# Patient Record
Sex: Male | Born: 1950 | Race: White | Hispanic: No | Marital: Married | Smoking: Never smoker
Health system: Southern US, Community
[De-identification: ages and names within clinical notes are randomized; demographics above are authoritative.]

## PROBLEM LIST (undated history)

## (undated) DIAGNOSIS — Z8739 Personal history of other diseases of the musculoskeletal system and connective tissue: Secondary | ICD-10-CM

## (undated) DIAGNOSIS — G4730 Sleep apnea, unspecified: Secondary | ICD-10-CM

## (undated) DIAGNOSIS — M1990 Unspecified osteoarthritis, unspecified site: Secondary | ICD-10-CM

## (undated) DIAGNOSIS — M2550 Pain in unspecified joint: Secondary | ICD-10-CM

## (undated) DIAGNOSIS — M5430 Sciatica, unspecified side: Secondary | ICD-10-CM

## (undated) DIAGNOSIS — Z9889 Other specified postprocedural states: Secondary | ICD-10-CM

## (undated) DIAGNOSIS — I10 Essential (primary) hypertension: Secondary | ICD-10-CM

## (undated) DIAGNOSIS — M1712 Unilateral primary osteoarthritis, left knee: Secondary | ICD-10-CM

## (undated) DIAGNOSIS — M2540 Effusion, unspecified joint: Secondary | ICD-10-CM

## (undated) DIAGNOSIS — R112 Nausea with vomiting, unspecified: Secondary | ICD-10-CM

## (undated) DIAGNOSIS — J302 Other seasonal allergic rhinitis: Secondary | ICD-10-CM

## (undated) HISTORY — PX: TONSILLECTOMY: SUR1361

## (undated) HISTORY — PX: KNEE ARTHROSCOPY: SUR90

## (undated) HISTORY — PX: OTHER SURGICAL HISTORY: SHX169

## (undated) HISTORY — PX: BACK SURGERY: SHX140

---

## 2013-09-03 HISTORY — PX: JOINT REPLACEMENT: SHX530

## 2013-10-19 ENCOUNTER — Other Ambulatory Visit: Payer: Self-pay | Admitting: Orthopedic Surgery

## 2013-10-21 ENCOUNTER — Other Ambulatory Visit: Payer: Self-pay | Admitting: Orthopedic Surgery

## 2013-10-29 ENCOUNTER — Encounter (HOSPITAL_COMMUNITY): Payer: Self-pay | Admitting: Pharmacy Technician

## 2013-10-30 NOTE — Pre-Procedure Instructions (Signed)
Grier RocherJerry W Pinckney  10/30/2013   Your procedure is scheduled on:  Mon, Mar 9 @ 7:30 AM  [facility]  at 5:30 AM.  Call this number if you have problems the morning of surgery: [phone]   Remember:   Do not eat food or drink liquids after midnight.   Take these medicines the morning of surgery with A SIP OF WATER: Zyrtec(Cetirizine),Afrin(if needed),and Pain Pill(if needed)               Stop taking your Aspirin,Aleve,and Ibuprofen. No Goody's,BC's,Fish Oil,or any Herbal Medications   Do not wear jewelry  Do not wear lotions, powders, or colognes. You may wear deodorant.  Men may shave face and neck.  Do not bring valuables to the hospital.  [facility] is not responsible                  for any belongings or valuables.               Contacts, dentures or bridgework may not be worn into surgery.  Leave suitcase in the car. After surgery it may be brought to your room.  For patients admitted to the hospital, discharge time is determined by your                treatment team.                Special Instructions:  [place] - Preparing for Surgery  Before surgery, you can play an important role.  Because skin is not sterile, your skin needs to be as free of germs as possible.  You can reduce the number of germs on you skin by washing with CHG (chlorahexidine gluconate) soap before surgery.  CHG is an antiseptic cleaner which kills germs and bonds with the skin to continue killing germs even after washing.  Please DO NOT use if you have an allergy to CHG or antibacterial soaps.  If your skin becomes reddened/irritated stop using the CHG and inform your nurse when you arrive at Short Stay.  Do not shave (including legs and underarms) for at least 48 hours prior to the first CHG shower.  You may shave your face.  Please follow these instructions carefully:   1.  Shower with CHG Soap the night before surgery and the                                morning of  Surgery.  2.  If you choose to wash your hair, wash your hair first as usual with your       normal shampoo.  3.  After you shampoo, rinse your hair and body thoroughly to remove the                      Shampoo.  4.  Use CHG as you would any other liquid soap.  You can apply chg directly       to the skin and wash gently with scrungie or a clean washcloth.  5.  Apply the CHG Soap to your body ONLY FROM THE NECK DOWN.        Do not use on open wounds or open sores.  Avoid contact with your eyes,       ears, mouth and genitals (private parts).  Wash genitals (private parts)       with your  normal soap.  6.  Wash thoroughly, paying special attention to the area where your surgery        will be performed.  7.  Thoroughly rinse your body with warm water from the neck down.  8.  DO NOT shower/wash with your normal soap after using and rinsing off       the CHG Soap.  9.  Pat yourself dry with a clean towel.            10.  Wear clean pajamas.            11.  Place clean sheets on your bed the night of your first shower and do not        sleep with pets.  Day of Surgery  Do not apply any lotions/deoderants the morning of surgery.  Please wear clean clothes to the hospital/surgery center.     Please read over the following fact sheets that you were given: Pain Booklet, Coughing and Deep Breathing, Blood Transfusion Information, MRSA Information and Surgical Site Infection Prevention

## 2013-11-02 ENCOUNTER — Ambulatory Visit (HOSPITAL_COMMUNITY)
Admission: RE | Admit: 2013-11-02 | Discharge: 2013-11-02 | Disposition: A | Discharge status: Home / Self Care | Payer: 59 | Source: Ambulatory Visit | Attending: Orthopedic Surgery | Admitting: Orthopedic Surgery

## 2013-11-02 ENCOUNTER — Encounter (HOSPITAL_COMMUNITY)
Admission: RE | Admit: 2013-11-02 | Discharge: 2013-11-02 | Disposition: A | Discharge status: Home / Self Care | Payer: 59 | Source: Ambulatory Visit | Attending: Orthopedic Surgery | Admitting: Orthopedic Surgery

## 2013-11-02 ENCOUNTER — Encounter (HOSPITAL_COMMUNITY): Payer: Self-pay

## 2013-11-02 DIAGNOSIS — Z01812 Encounter for preprocedural laboratory examination: Secondary | ICD-10-CM | POA: Insufficient documentation

## 2013-11-02 HISTORY — DX: Effusion, unspecified joint: M25.40

## 2013-11-02 HISTORY — DX: Essential (primary) hypertension: I10

## 2013-11-02 HISTORY — DX: Unspecified osteoarthritis, unspecified site: M19.90

## 2013-11-02 HISTORY — DX: Pain in unspecified joint: M25.50

## 2013-11-02 HISTORY — DX: Other specified postprocedural states: Z98.890

## 2013-11-02 HISTORY — DX: Sciatica, unspecified side: M54.30

## 2013-11-02 HISTORY — DX: Personal history of other diseases of the musculoskeletal system and connective tissue: Z87.39

## 2013-11-02 HISTORY — DX: Nausea with vomiting, unspecified: R11.2

## 2013-11-02 HISTORY — DX: Other seasonal allergic rhinitis: J30.2

## 2013-11-02 LAB — COMPREHENSIVE METABOLIC PANEL
ALT: 43 U/L (ref 0–53)
AST: 42 U/L — ABNORMAL HIGH (ref 0–37)
Albumin: 3.9 g/dL (ref 3.5–5.2)
Alkaline Phosphatase: 66 U/L (ref 39–117)
BUN: 17 mg/dL (ref 6–23)
CALCIUM: 9.4 mg/dL (ref 8.4–10.5)
CO2: 29 mEq/L (ref 19–32)
CREATININE: 1.01 mg/dL (ref 0.50–1.35)
Chloride: 98 mEq/L (ref 96–112)
GFR, EST NON AFRICAN AMERICAN: 78 mL/min — AB (ref >90)
GLUCOSE: 107 mg/dL — AB (ref 70–99)
Potassium: 3.9 mEq/L (ref 3.7–5.3)
SODIUM: 138 meq/L (ref 137–147)
TOTAL PROTEIN: 7.7 g/dL (ref 6.0–8.3)
Total Bilirubin: 1.5 mg/dL — ABNORMAL HIGH (ref 0.3–1.2)

## 2013-11-02 LAB — APTT: aPTT: 25 seconds (ref 24–37)

## 2013-11-02 LAB — CBC WITH DIFFERENTIAL/PLATELET
Basophils Absolute: 0 10*3/uL (ref 0.0–0.1)
Basophils Relative: 0 % (ref 0–1)
EOS ABS: 0.1 10*3/uL (ref 0.0–0.7)
EOS PCT: 3 % (ref 0–5)
HEMATOCRIT: 45.7 % (ref 39.0–52.0)
Hemoglobin: 15.8 g/dL (ref 13.0–17.0)
LYMPHS ABS: 1.3 10*3/uL (ref 0.7–4.0)
Lymphocytes Relative: 24 % (ref 12–46)
MCH: 31.8 pg (ref 26.0–34.0)
MCHC: 34.6 g/dL (ref 30.0–36.0)
MCV: 92 fL (ref 78.0–100.0)
MONO ABS: 0.5 10*3/uL (ref 0.1–1.0)
Monocytes Relative: 9 % (ref 3–12)
Neutro Abs: 3.4 10*3/uL (ref 1.7–7.7)
Neutrophils Relative %: 64 % (ref 43–77)
PLATELETS: 211 10*3/uL (ref 150–400)
RBC: 4.97 MIL/uL (ref 4.22–5.81)
RDW: 12.9 % (ref 11.5–15.5)
WBC: 5.3 10*3/uL (ref 4.0–10.5)

## 2013-11-02 LAB — SURGICAL PCR SCREEN
MRSA, PCR: NEGATIVE
Staphylococcus aureus: NEGATIVE

## 2013-11-02 LAB — PROTIME-INR
INR: 1.05 (ref 0.00–1.49)
PROTHROMBIN TIME: 13.5 s (ref 11.6–15.2)

## 2013-11-02 LAB — TYPE AND SCREEN
ABO/RH(D): O NEG
Antibody Screen: NEGATIVE

## 2013-11-02 LAB — ABO/RH: ABO/RH(D): O NEG

## 2013-11-02 MED ORDER — CHLORHEXIDINE GLUCONATE 4 % EX LIQD: 60.0000 mL | Freq: Once | CUTANEOUS | Status: DC

## 2013-11-02 NOTE — Progress Notes (Signed)
Measured pt for Ted hose and gave daughter his measurement;she owns a company that sells Teds.He will bring them DOS

## 2013-11-02 NOTE — Progress Notes (Signed)
11/02/13 0911  OBSTRUCTIVE SLEEP APNEA  Have you ever been diagnosed with sleep apnea through a sleep study? No  Do you snore loudly (loud enough to be heard through closed doors)?  1  Do you often feel tired, fatigued, or sleepy during the daytime? 0  Has anyone observed you stop breathing during your sleep? 1  Do you have, or are you being treated for high blood pressure? 1  BMI more than 35 kg/m2? 0  Age over 63 years old? 1  Neck circumference greater than 40 cm/18 inches? 0 (17 1/2)  Gender: 1  Obstructive Sleep Apnea Score 5  Score 4 or greater  Results sent to PCP

## 2013-11-02 NOTE — Progress Notes (Signed)
Called main lab to see why UA and Culture was cancelled;urine cup was either broken in transit and urine leaked out;not enough to obtain sample

## 2013-11-02 NOTE — Progress Notes (Addendum)
Pt doesn't have a cardiologist  [facility] done at [facility] [place]  [place]cho done at [facility] [place]   Stress test done 20+yrs ago  Pt denies ever having a heart cath  Denies CXR in past yr    Medical Md is Dr.Byron Mikey BussingHoffman sees NP Jacqlyn KraussMargaret Brewer

## 2013-11-08 MED ORDER — DEXTROSE 5 % IV SOLN
3.0000 g | INTRAVENOUS | Status: AC
  Administered 2013-11-09: 3 g via INTRAVENOUS
  Filled 2013-11-08 (×2): qty 3000

## 2013-11-08 MED ORDER — TRANEXAMIC ACID 100 MG/ML IV SOLN
1000.0000 mg | INTRAVENOUS | Status: AC
  Administered 2013-11-09: 1000 mg via INTRAVENOUS
  Filled 2013-11-08: qty 10

## 2013-11-09 ENCOUNTER — Inpatient Hospital Stay (HOSPITAL_COMMUNITY): Payer: 59 | Admitting: Anesthesiology

## 2013-11-09 ENCOUNTER — Encounter (HOSPITAL_COMMUNITY): Payer: 59 | Admitting: Anesthesiology

## 2013-11-09 ENCOUNTER — Encounter (HOSPITAL_COMMUNITY): Payer: Self-pay | Admitting: Anesthesiology

## 2013-11-09 ENCOUNTER — Encounter (HOSPITAL_COMMUNITY)
Admission: RE | Disposition: A | Discharge status: Home / Self Care | Payer: Self-pay | Source: Ambulatory Visit | Attending: Orthopedic Surgery

## 2013-11-09 ENCOUNTER — Inpatient Hospital Stay (HOSPITAL_COMMUNITY)
Admission: RE | Admit: 2013-11-09 | Discharge: 2013-11-10 | DRG: 470 | Disposition: A | Discharge status: Home / Self Care | Payer: 59 | Source: Ambulatory Visit | Attending: Orthopedic Surgery | Admitting: Orthopedic Surgery

## 2013-11-09 DIAGNOSIS — Z79899 Other long term (current) drug therapy: Secondary | ICD-10-CM

## 2013-11-09 DIAGNOSIS — I10 Essential (primary) hypertension: Secondary | ICD-10-CM | POA: Diagnosis present

## 2013-11-09 DIAGNOSIS — D62 Acute posthemorrhagic anemia: Secondary | ICD-10-CM | POA: Diagnosis not present

## 2013-11-09 DIAGNOSIS — M1710 Unilateral primary osteoarthritis, unspecified knee: Principal | ICD-10-CM | POA: Diagnosis present

## 2013-11-09 DIAGNOSIS — Z96659 Presence of unspecified artificial knee joint: Secondary | ICD-10-CM

## 2013-11-09 DIAGNOSIS — Z7982 Long term (current) use of aspirin: Secondary | ICD-10-CM

## 2013-11-09 HISTORY — PX: TOTAL KNEE ARTHROPLASTY: SHX125

## 2013-11-09 LAB — CBC
HEMATOCRIT: 42.5 % (ref 39.0–52.0)
HEMOGLOBIN: 15.2 g/dL (ref 13.0–17.0)
MCH: 32.5 pg (ref 26.0–34.0)
MCHC: 35.8 g/dL (ref 30.0–36.0)
MCV: 91 fL (ref 78.0–100.0)
Platelets: 187 10*3/uL (ref 150–400)
RBC: 4.67 MIL/uL (ref 4.22–5.81)
RDW: 12.8 % (ref 11.5–15.5)
WBC: 9 10*3/uL (ref 4.0–10.5)

## 2013-11-09 LAB — CREATININE, SERUM
CREATININE: 0.94 mg/dL (ref 0.50–1.35)
GFR calc Af Amer: >90 mL/min (ref >90)
GFR, EST NON AFRICAN AMERICAN: 88 mL/min — AB (ref >90)

## 2013-11-09 LAB — URINALYSIS, ROUTINE W REFLEX MICROSCOPIC
Glucose, UA: NEGATIVE mg/dL
Hgb urine dipstick: NEGATIVE
Ketones, ur: NEGATIVE mg/dL
LEUKOCYTES UA: NEGATIVE
NITRITE: NEGATIVE
PROTEIN: NEGATIVE mg/dL
Specific Gravity, Urine: 1.019 (ref 1.005–1.030)
Urobilinogen, UA: 1 mg/dL (ref 0.0–1.0)
pH: 6 (ref 5.0–8.0)

## 2013-11-09 SURGERY — ARTHROPLASTY, KNEE, TOTAL
Anesthesia: Regional | Site: Knee | Laterality: Right

## 2013-11-09 MED ORDER — SODIUM CHLORIDE 0.9 % IR SOLN
Status: DC | PRN
  Administered 2013-11-09: 1000 mL

## 2013-11-09 MED ORDER — MENTHOL 3 MG MT LOZG: 1.0000 | LOZENGE | OROMUCOSAL | Status: DC | PRN

## 2013-11-09 MED ORDER — ENOXAPARIN SODIUM 30 MG/0.3ML ~~LOC~~ SOLN
30.0000 mg | Freq: Two times a day (BID) | SUBCUTANEOUS | Status: DC
  Administered 2013-11-10: 30 mg via SUBCUTANEOUS
  Filled 2013-11-09 (×3): qty 0.3

## 2013-11-09 MED ORDER — ONDANSETRON HCL 4 MG/2ML IJ SOLN
4.0000 mg | Freq: Four times a day (QID) | INTRAMUSCULAR | Status: DC | PRN
  Administered 2013-11-09: 4 mg via INTRAVENOUS
  Filled 2013-11-09: qty 2

## 2013-11-09 MED ORDER — ACETAMINOPHEN 650 MG RE SUPP: 650.0000 mg | Freq: Four times a day (QID) | RECTAL | Status: DC | PRN

## 2013-11-09 MED ORDER — OXYCODONE HCL 5 MG PO TABS
5.0000 mg | ORAL_TABLET | ORAL | Status: DC | PRN
  Administered 2013-11-09 – 2013-11-10 (×7): 10 mg via ORAL
  Filled 2013-11-09 (×8): qty 2

## 2013-11-09 MED ORDER — SODIUM CHLORIDE 0.9 % IV SOLN
INTRAVENOUS | Status: DC
Start: 2013-11-09 — End: 2013-11-09

## 2013-11-09 MED ORDER — HYDROCHLOROTHIAZIDE 25 MG PO TABS
25.0000 mg | ORAL_TABLET | Freq: Every day | ORAL | Status: DC
  Administered 2013-11-10: 25 mg via ORAL
  Filled 2013-11-09 (×2): qty 1

## 2013-11-09 MED ORDER — FENTANYL CITRATE 0.05 MG/ML IJ SOLN
INTRAMUSCULAR | Status: DC | PRN
  Administered 2013-11-09: 50 ug via INTRAVENOUS
  Administered 2013-11-09: 100 ug via INTRAVENOUS
  Administered 2013-11-09 (×2): 50 ug via INTRAVENOUS

## 2013-11-09 MED ORDER — LORATADINE 10 MG PO TABS
10.0000 mg | ORAL_TABLET | Freq: Every day | ORAL | Status: DC
  Administered 2013-11-10: 10 mg via ORAL
  Filled 2013-11-09 (×2): qty 1

## 2013-11-09 MED ORDER — METHOCARBAMOL 100 MG/ML IJ SOLN
500.0000 mg | Freq: Four times a day (QID) | INTRAVENOUS | Status: DC | PRN
  Administered 2013-11-09: 500 mg via INTRAVENOUS
  Filled 2013-11-09: qty 5

## 2013-11-09 MED ORDER — OXYCODONE HCL ER 10 MG PO T12A
10.0000 mg | EXTENDED_RELEASE_TABLET | Freq: Two times a day (BID) | ORAL | Status: DC
  Administered 2013-11-09 – 2013-11-10 (×3): 10 mg via ORAL
  Filled 2013-11-09 (×3): qty 1

## 2013-11-09 MED ORDER — METOCLOPRAMIDE HCL 5 MG/ML IJ SOLN
5.0000 mg | Freq: Three times a day (TID) | INTRAMUSCULAR | Status: DC | PRN
Start: 2013-11-09 — End: 2013-11-10

## 2013-11-09 MED ORDER — OXYMETAZOLINE HCL 0.05 % NA SOLN
1.0000 | Freq: Two times a day (BID) | NASAL | Status: DC | PRN
  Filled 2013-11-09: qty 15

## 2013-11-09 MED ORDER — LIDOCAINE HCL (CARDIAC) 20 MG/ML IV SOLN
INTRAVENOUS | Status: DC | PRN
  Administered 2013-11-09: 100 mg via INTRAVENOUS

## 2013-11-09 MED ORDER — DOCUSATE SODIUM 100 MG PO CAPS
100.0000 mg | ORAL_CAPSULE | Freq: Two times a day (BID) | ORAL | Status: DC
  Administered 2013-11-09 – 2013-11-10 (×2): 100 mg via ORAL
  Filled 2013-11-09 (×3): qty 1

## 2013-11-09 MED ORDER — DIPHENHYDRAMINE HCL 12.5 MG/5ML PO ELIX
12.5000 mg | ORAL_SOLUTION | ORAL | Status: DC | PRN
  Administered 2013-11-09 (×2): 25 mg via ORAL
  Filled 2013-11-09 (×3): qty 10

## 2013-11-09 MED ORDER — BUPIVACAINE-EPINEPHRINE 0.5% -1:200000 IJ SOLN
INTRAMUSCULAR | Status: DC | PRN
  Administered 2013-11-09: 30 mL

## 2013-11-09 MED ORDER — PROPOFOL 10 MG/ML IV BOLUS
INTRAVENOUS | Status: AC
  Filled 2013-11-09: qty 20

## 2013-11-09 MED ORDER — HYDROMORPHONE HCL PF 1 MG/ML IJ SOLN
0.2500 mg | INTRAMUSCULAR | Status: DC | PRN
  Administered 2013-11-09 (×4): 0.5 mg via INTRAVENOUS

## 2013-11-09 MED ORDER — BUPIVACAINE LIPOSOME 1.3 % IJ SUSP
20.0000 mL | Freq: Once | INTRAMUSCULAR | Status: DC
  Filled 2013-11-09: qty 20

## 2013-11-09 MED ORDER — MIDAZOLAM HCL 2 MG/2ML IJ SOLN
INTRAMUSCULAR | Status: AC
  Filled 2013-11-09: qty 2

## 2013-11-09 MED ORDER — ONDANSETRON HCL 4 MG/2ML IJ SOLN
INTRAMUSCULAR | Status: AC
  Filled 2013-11-09: qty 2

## 2013-11-09 MED ORDER — HYDROMORPHONE HCL PF 1 MG/ML IJ SOLN
INTRAMUSCULAR | Status: AC
  Filled 2013-11-09: qty 1

## 2013-11-09 MED ORDER — ALUM & MAG HYDROXIDE-SIMETH 200-200-20 MG/5ML PO SUSP: 30.0000 mL | ORAL | Status: DC | PRN

## 2013-11-09 MED ORDER — DEXAMETHASONE SODIUM PHOSPHATE 4 MG/ML IJ SOLN
INTRAMUSCULAR | Status: DC | PRN
Start: 2013-11-09 — End: 2013-11-09
  Administered 2013-11-09: 4 mg via INTRAVENOUS

## 2013-11-09 MED ORDER — ACETAMINOPHEN 325 MG PO TABS: 650.0000 mg | ORAL_TABLET | Freq: Four times a day (QID) | ORAL | Status: DC | PRN

## 2013-11-09 MED ORDER — PROPOFOL 10 MG/ML IV BOLUS
INTRAVENOUS | Status: DC | PRN
  Administered 2013-11-09: 200 mg via INTRAVENOUS

## 2013-11-09 MED ORDER — CEFAZOLIN SODIUM-DEXTROSE 2-3 GM-% IV SOLR
2.0000 g | Freq: Four times a day (QID) | INTRAVENOUS | Status: AC
  Administered 2013-11-09: 2 g via INTRAVENOUS
  Filled 2013-11-09 (×2): qty 50

## 2013-11-09 MED ORDER — BUPIVACAINE-EPINEPHRINE PF 0.5-1:200000 % IJ SOLN
INTRAMUSCULAR | Status: DC | PRN
  Administered 2013-11-09: 20 mL via PERINEURAL

## 2013-11-09 MED ORDER — ONDANSETRON HCL 4 MG PO TABS: 4.0000 mg | ORAL_TABLET | Freq: Four times a day (QID) | ORAL | Status: DC | PRN

## 2013-11-09 MED ORDER — LACTATED RINGERS IV SOLN
INTRAVENOUS | Status: DC | PRN
  Administered 2013-11-09 (×2): via INTRAVENOUS

## 2013-11-09 MED ORDER — ARTIFICIAL TEARS OP OINT
TOPICAL_OINTMENT | OPHTHALMIC | Status: AC
  Filled 2013-11-09: qty 3.5

## 2013-11-09 MED ORDER — GLYCOPYRROLATE 0.2 MG/ML IJ SOLN
INTRAMUSCULAR | Status: DC | PRN
  Administered 2013-11-09 (×2): 0.2 mg via INTRAVENOUS

## 2013-11-09 MED ORDER — ONDANSETRON HCL 4 MG/2ML IJ SOLN: 4.0000 mg | Freq: Once | INTRAMUSCULAR | Status: DC | PRN

## 2013-11-09 MED ORDER — LISINOPRIL 40 MG PO TABS
40.0000 mg | ORAL_TABLET | Freq: Every day | ORAL | Status: DC
  Administered 2013-11-10: 40 mg via ORAL
  Filled 2013-11-09 (×2): qty 1

## 2013-11-09 MED ORDER — ONDANSETRON HCL 4 MG/2ML IJ SOLN
INTRAMUSCULAR | Status: DC | PRN
  Administered 2013-11-09: 4 mg via INTRAVENOUS

## 2013-11-09 MED ORDER — METOCLOPRAMIDE HCL 10 MG PO TABS: 5.0000 mg | ORAL_TABLET | Freq: Three times a day (TID) | ORAL | Status: DC | PRN

## 2013-11-09 MED ORDER — PHENOL 1.4 % MT LIQD: 1.0000 | OROMUCOSAL | Status: DC | PRN

## 2013-11-09 MED ORDER — ROCURONIUM BROMIDE 50 MG/5ML IV SOLN
INTRAVENOUS | Status: AC
  Filled 2013-11-09: qty 1

## 2013-11-09 MED ORDER — BUPIVACAINE-EPINEPHRINE (PF) 0.5% -1:200000 IJ SOLN
INTRAMUSCULAR | Status: AC
  Filled 2013-11-09: qty 10

## 2013-11-09 MED ORDER — CELECOXIB 200 MG PO CAPS
200.0000 mg | ORAL_CAPSULE | Freq: Two times a day (BID) | ORAL | Status: DC
  Administered 2013-11-09 – 2013-11-10 (×2): 200 mg via ORAL
  Filled 2013-11-09 (×4): qty 1

## 2013-11-09 MED ORDER — METHOCARBAMOL 500 MG PO TABS
500.0000 mg | ORAL_TABLET | Freq: Four times a day (QID) | ORAL | Status: DC | PRN
  Administered 2013-11-09 – 2013-11-10 (×2): 500 mg via ORAL
  Filled 2013-11-09 (×4): qty 1

## 2013-11-09 MED ORDER — ARTIFICIAL TEARS OP OINT
TOPICAL_OINTMENT | OPHTHALMIC | Status: DC | PRN
  Administered 2013-11-09: 1 via OPHTHALMIC

## 2013-11-09 MED ORDER — FENTANYL CITRATE 0.05 MG/ML IJ SOLN
INTRAMUSCULAR | Status: AC
  Filled 2013-11-09: qty 5

## 2013-11-09 MED ORDER — SENNOSIDES-DOCUSATE SODIUM 8.6-50 MG PO TABS: 1.0000 | ORAL_TABLET | Freq: Every evening | ORAL | Status: DC | PRN

## 2013-11-09 MED ORDER — BISACODYL 5 MG PO TBEC: 5.0000 mg | DELAYED_RELEASE_TABLET | Freq: Every day | ORAL | Status: DC | PRN

## 2013-11-09 MED ORDER — BUPIVACAINE LIPOSOME 1.3 % IJ SUSP
INTRAMUSCULAR | Status: DC | PRN
  Administered 2013-11-09: 20 mL

## 2013-11-09 MED ORDER — HYDROMORPHONE HCL PF 1 MG/ML IJ SOLN
1.0000 mg | INTRAMUSCULAR | Status: DC | PRN
Start: 2013-11-09 — End: 2013-11-10
  Filled 2013-11-09: qty 1

## 2013-11-09 MED ORDER — FLEET ENEMA 7-19 GM/118ML RE ENEM
1.0000 | ENEMA | Freq: Once | RECTAL | Status: AC | PRN
Start: 2013-11-09 — End: 2013-11-09

## 2013-11-09 MED ORDER — LIDOCAINE HCL (CARDIAC) 20 MG/ML IV SOLN
INTRAVENOUS | Status: AC
  Filled 2013-11-09: qty 5

## 2013-11-09 MED ORDER — SODIUM CHLORIDE 0.9 % IV SOLN: INTRAVENOUS | Status: DC

## 2013-11-09 SURGICAL SUPPLY — 55 items
BANDAGE ESMARK 6X9 LF (GAUZE/BANDAGES/DRESSINGS) ×1 IMPLANT
BLADE SAGITTAL 13X1.27X60 (BLADE) ×2 IMPLANT
BLADE SAGITTAL 13X1.27X60MM (BLADE) ×1
BLADE SAW SGTL 83.5X18.5 (BLADE) ×3 IMPLANT
BNDG CMPR 9X6 STRL LF SNTH (GAUZE/BANDAGES/DRESSINGS) ×1
BNDG ESMARK 6X9 LF (GAUZE/BANDAGES/DRESSINGS) ×3
BOWL SMART MIX CTS (DISPOSABLE) ×3 IMPLANT
CAP POR NKTM CP VIT E LN CER ×2 IMPLANT
CEMENT BONE SIMPLEX SPEEDSET (Cement) ×6 IMPLANT
COVER SURGICAL LIGHT HANDLE (MISCELLANEOUS) ×3 IMPLANT
CUFF TOURNIQUET SINGLE 34IN LL (TOURNIQUET CUFF) ×3 IMPLANT
DRAPE EXTREMITY T 121X128X90 (DRAPE) ×3 IMPLANT
DRAPE INCISE IOBAN 66X45 STRL (DRAPES) ×6 IMPLANT
DRAPE PROXIMA HALF (DRAPES) ×3 IMPLANT
DRAPE U-SHAPE 47X51 STRL (DRAPES) ×3 IMPLANT
DRSG ADAPTIC 3X8 NADH LF (GAUZE/BANDAGES/DRESSINGS) ×3 IMPLANT
DRSG PAD ABDOMINAL 8X10 ST (GAUZE/BANDAGES/DRESSINGS) ×3 IMPLANT
DURAPREP 26ML APPLICATOR (WOUND CARE) ×6 IMPLANT
ELECT REM PT RETURN 9FT ADLT (ELECTROSURGICAL) ×3
ELECTRODE REM PT RTRN 9FT ADLT (ELECTROSURGICAL) ×1 IMPLANT
EVACUATOR 1/8 PVC DRAIN (DRAIN) ×3 IMPLANT
GLOVE BIOGEL M 7.0 STRL (GLOVE) IMPLANT
GLOVE BIOGEL PI IND STRL 7.5 (GLOVE) IMPLANT
GLOVE BIOGEL PI IND STRL 8.5 (GLOVE) ×2 IMPLANT
GLOVE BIOGEL PI INDICATOR 7.5 (GLOVE)
GLOVE BIOGEL PI INDICATOR 8.5 (GLOVE) ×4
GLOVE SURG ORTHO 8.0 STRL STRW (GLOVE) ×6 IMPLANT
GOWN PREVENTION PLUS XLARGE (GOWN DISPOSABLE) ×6 IMPLANT
GOWN STRL NON-REIN LRG LVL3 (GOWN DISPOSABLE) ×6 IMPLANT
HANDPIECE INTERPULSE COAX TIP (DISPOSABLE) ×3
HOOD PEEL AWAY FACE SHEILD DIS (HOOD) ×12 IMPLANT
KIT BASIN OR (CUSTOM PROCEDURE TRAY) ×3 IMPLANT
KIT ROOM TURNOVER OR (KITS) ×3 IMPLANT
MANIFOLD NEPTUNE II (INSTRUMENTS) ×3 IMPLANT
NEEDLE 22X1 1/2 (OR ONLY) (NEEDLE) ×6 IMPLANT
NS IRRIG 1000ML POUR BTL (IV SOLUTION) ×3 IMPLANT
PACK TOTAL JOINT (CUSTOM PROCEDURE TRAY) ×3 IMPLANT
PAD ABD 8X10 STRL (GAUZE/BANDAGES/DRESSINGS) ×2 IMPLANT
PAD ARMBOARD 7.5X6 YLW CONV (MISCELLANEOUS) ×6 IMPLANT
PADDING CAST COTTON 6X4 STRL (CAST SUPPLIES) ×3 IMPLANT
SET HNDPC FAN SPRY TIP SCT (DISPOSABLE) ×1 IMPLANT
SPONGE GAUZE 4X4 12PLY (GAUZE/BANDAGES/DRESSINGS) ×3 IMPLANT
STAPLER VISISTAT 35W (STAPLE) ×3 IMPLANT
SUCTION FRAZIER TIP 10 FR DISP (SUCTIONS) ×3 IMPLANT
SUT BONE WAX W31G (SUTURE) ×3 IMPLANT
SUT VIC AB 0 CTB1 27 (SUTURE) ×6 IMPLANT
SUT VIC AB 1 CT1 27 (SUTURE) ×6
SUT VIC AB 1 CT1 27XBRD ANBCTR (SUTURE) ×2 IMPLANT
SUT VIC AB 2-0 CT1 27 (SUTURE) ×6
SUT VIC AB 2-0 CT1 TAPERPNT 27 (SUTURE) ×2 IMPLANT
SYR CONTROL 10ML LL (SYRINGE) ×6 IMPLANT
TOWEL OR 17X24 6PK STRL BLUE (TOWEL DISPOSABLE) ×3 IMPLANT
TOWEL OR 17X26 10 PK STRL BLUE (TOWEL DISPOSABLE) ×3 IMPLANT
TRAY FOLEY CATH 14FR (SET/KITS/TRAYS/PACK) ×1 IMPLANT
WATER STERILE IRR 1000ML POUR (IV SOLUTION) ×2 IMPLANT

## 2013-11-09 NOTE — Progress Notes (Signed)
Long term analgeasic oxycontin given at 1230 pm, in place of am 1000 dose( late)

## 2013-11-09 NOTE — H&P (Signed)
Daniel Johns MRN:  [id number] DOB/SEX:  December 22, 1950/male  CHIEF COMPLAINT:  Painful right Knee  HISTORY: Patient is a 63 y.o. male presented with a history of pain in the right knee. Onset of symptoms was gradual starting several years ago with gradually worsening course since that time. Prior procedures on the knee include none. Patient has been treated conservatively with over-the-counter NSAIDs and activity modification. Patient currently rates pain in the knee at 8 out of 10 with activity. There is pain at night.  PAST MEDICAL HISTORY: There are no active problems to display for this patient.  Past Medical History  Diagnosis Date  . Seasonal allergies     takes Zyrtec daily and Afrin nasal spray daily as needed for congestion  . Joint pain     takes Hydrocodone as needed and Ibuprofen as well if needed  . Arthritis   . PONV (postoperative nausea and vomiting)     slow to wake up  . Hypertension     takes Lisinopril and HCTZ daily  . Joint swelling   . Sciatica     occaionally  . History of gout    Past Surgical History  Procedure Laterality Date  . Tonsillectomy    . Sty removed from left eye    . Knee arthroscopy      x 2  . Left knee arthroscopy      x 1  . Bone spurs removed from left shoulder       MEDICATIONS:   Prescriptions prior to admission  Medication Sig Dispense Refill  . aspirin EC 81 MG tablet Take 81 mg by mouth daily.      . cetirizine (ZYRTEC) 10 MG tablet Take 10 mg by mouth daily.      . hydrochlorothiazide (HYDRODIURIL) 25 MG tablet Take 25 mg by mouth daily.      Marland Kitchen. HYDROcodone-acetaminophen (NORCO/VICODIN) 5-325 MG per tablet Take 1 tablet by mouth every 12 (twelve) hours as needed for moderate pain.      Marland Kitchen. ibuprofen (ADVIL,MOTRIN) 200 MG tablet Take 200 mg by mouth every 6 (six) hours as needed for moderate pain.      Marland Kitchen. lisinopril (PRINIVIL,ZESTRIL) 40 MG tablet Take 40 mg by mouth daily.      . meloxicam (MOBIC) 15 MG tablet Take 15 mg by  mouth daily.      . naproxen sodium (ANAPROX) 220 MG tablet Take 220 mg by mouth daily as needed (pain).      Marland Kitchen. oxymetazoline (AFRIN) 0.05 % nasal spray Place 1 spray into both nostrils 2 (two) times daily as needed for congestion.        ALLERGIES:  No Known Allergies  REVIEW OF SYSTEMS:  Pertinent items are noted in HPI.   FAMILY HISTORY:  No family history on file.  SOCIAL HISTORY:   History  Substance Use Topics  . Smoking status: Never Smoker   . Smokeless tobacco: Not on file  . Alcohol Use: Yes     Comment: daily beer     EXAMINATION:  Vital signs in last 24 hours: Temp:  [97.8 F (36.6 C)] 97.8 F (36.6 C) (03/09 0603) Pulse Rate:  [68] 68 (03/09 0603) Resp:  [16] 16 (03/09 0603) BP: (143)/(87) 143/87 mmHg (03/09 0603) SpO2:  [96 %] 96 % (03/09 0603)  General appearance: alert, cooperative and no distress Lungs: clear to auscultation bilaterally Heart: regular rate and rhythm, S1, S2 normal, no murmur, click, rub or gallop Abdomen: soft, non-tender; bowel sounds  normal; no masses,  no organomegaly Extremities: extremities normal, atraumatic, no cyanosis or edema and Homans sign is negative, no sign of DVT Pulses: 2+ and symmetric Skin: Skin color, texture, turgor normal. No rashes or lesions Neurologic: Alert and oriented X 3, normal strength and tone. Normal symmetric reflexes. Normal coordination and gait  Musculoskeletal:  ROM 0-115, Ligaments intact,  Imaging Review Plain radiographs demonstrate severe degenerative joint disease of the right knee. The overall alignment is mild valgus. The bone quality appears to be good for age and reported activity level.  Assessment/Plan: End stage arthritis, right knee   The patient history, physical examination and imaging studies are consistent with advanced degenerative joint disease of the right knee. The patient has failed conservative treatment.  The clearance notes were reviewed.  After discussion with the  patient it was felt that Total Knee Replacement was indicated. The procedure,  risks, and benefits of total knee arthroplasty were presented and reviewed. The risks including but not limited to aseptic loosening, infection, blood clots, vascular injury, stiffness, patella tracking problems complications among others were discussed. The patient acknowledged the explanation, agreed to proceed with the plan.  Jacob Chamblee 11/09/2013, 6:48 AM

## 2013-11-09 NOTE — Anesthesia Procedure Notes (Addendum)
Procedure Name: LMA Insertion Date/Time: 11/09/2013 7:43 AM Performed by: Fransisca KaufmannMEYER, MARY E Pre-anesthesia Checklist: Patient identified, Emergency Drugs available, Suction available, Timeout performed and Patient being monitored Patient Re-evaluated:Patient Re-evaluated prior to inductionOxygen Delivery Method: Circle system utilized Preoxygenation: Pre-oxygenation with 100% oxygen Intubation Type: IV induction Ventilation: Mask ventilation without difficulty LMA: LMA inserted LMA Size: 4.0 Number of attempts: 1 Placement Confirmation: positive ETCO2 and breath sounds checked- equal and bilateral Tube secured with: Tape Dental Injury: Teeth and Oropharynx as per pre-operative assessment    Anesthesia Regional Block:  Femoral nerve block  Pre-Anesthetic Checklist: ,, timeout performed, Correct Patient, Correct Site, Correct Laterality, Correct Procedure, Correct Position, site marked, Risks and benefits discussed,  Surgical consent,  Pre-op evaluation,  At surgeon's request and post-op pain management  Laterality: Right  Prep: Maximum Sterile Barrier Precautions used, chloraprep and alcohol swabs       Needles:  Injection technique: Single-shot  Needle Type: Stimulator Needle - 80        Needle insertion depth: 6 cm   Additional Needles:  Procedures: nerve stimulator Femoral nerve block  Nerve Stimulator or Paresthesia:  Response: 0.5 mA, 0.1 ms, 6 cm  Additional Responses:   Narrative:  Start time: 11/09/2013 7:00 AM End time: 11/09/2013 7:05 AM Injection made incrementally with aspirations every 5 mL.  Performed by: Personally  Anesthesiologist: Maren BeachGregory E Shyann Hefner MD  Additional Notes: Pt accepts procedure w/ risks. 20 cc 0.5% Marcaine w/ epi w/o difficulty or discomfort. GES

## 2013-11-09 NOTE — Transfer of Care (Signed)
Immediate Anesthesia Transfer of Care Note  Patient: Daniel Johns  Procedure(s) Performed: Procedure(s): TOTAL KNEE ARTHROPLASTY (Right)  Patient Location: PACU  Anesthesia Type:General and Regional  Level of Consciousness: awake, alert , oriented and sedated  Airway & Oxygen Therapy: Patient Spontanous Breathing and Patient connected to nasal cannula oxygen  Post-op Assessment: Report given to PACU RN, Post -op Vital signs reviewed and stable and Patient moving all extremities  Post vital signs: Reviewed and stable  Complications: No apparent anesthesia complications

## 2013-11-09 NOTE — Progress Notes (Signed)
Orthopedic Tech Progress Note Patient Details:  Daniel RocherJerry W Clontz Feb 10, 1951 [id number] CPM applied to Right LE with appropriate settings. OHF applied to bed. CPM Right Knee CPM Right Knee: On Right Knee Flexion (Degrees): 90 Right Knee Extension (Degrees): 0   Asia R Thompson 11/09/2013, 10:07 AM

## 2013-11-09 NOTE — Progress Notes (Signed)
Utilization review completed.  

## 2013-11-09 NOTE — Preoperative (Signed)
Beta Blockers   Reason not to administer Beta Blockers:Not Applicable 

## 2013-11-09 NOTE — Evaluation (Signed)
Physical Therapy Evaluation Patient Details Name: Daniel Johns MRN: [id number] DOB: 02-17-1951 Today's Date: 11/09/2013 Time: 2725-3664 PT Time Calculation (min): 21 min  PT Assessment / Plan / Recommendation History of Present Illness  Patient is a 63 yo male s/p Rt TKA  Clinical Impression  Patient presents with problems listed below.  Will benefit from acute PT to maximize independence prior to discharge home with wife.    PT Assessment  Patient needs continued PT services    Follow Up Recommendations  Home health PT;Supervision/Assistance - 24 hour    Does the patient have the potential to tolerate intense rehabilitation      Barriers to Discharge        Equipment Recommendations  None recommended by PT    Recommendations for Other Services     Frequency 7X/week    Precautions / Restrictions Precautions Precautions: Knee Precaution Booklet Issued: Yes (comment) Precaution Comments: Provided education on precautions to patient and wife Restrictions Weight Bearing Restrictions: Yes RLE Weight Bearing: Weight bearing as tolerated   Pertinent Vitals/Pain       Mobility  Bed Mobility Overal bed mobility: Needs Assistance Bed Mobility: Supine to Sit Supine to sit: Min assist General bed mobility comments: Verbal cues for technique.  Assist to move RLE off of bed.  Good sitting balance once upright. Transfers Overall transfer level: Needs assistance Equipment used: Rolling walker (2 wheeled) Transfers: Sit to/from Stand Sit to Stand: Min assist General transfer comment: Verbal cues for hand placement and use of RW.  Assist to rise to standing and for balance. Ambulation/Gait Ambulation/Gait assistance: Min assist Ambulation Distance (Feet): 20 Feet Assistive device: Rolling walker (2 wheeled) Gait Pattern/deviations: Step-to pattern;Decreased stance time - right;Decreased step length - left;Decreased weight shift to right Gait velocity interpretation: Below  normal speed for age/gender General Gait Details: Verbal cues for safe use of RW and gait sequence.  Assist for balance and safety    Exercises Total Joint Exercises Ankle Circles/Pumps: AROM;Both;10 reps;Seated   PT Diagnosis: Difficulty walking;Abnormality of gait;Acute pain  PT Problem List: Decreased strength;Decreased range of motion;Decreased activity tolerance;Decreased balance;Decreased mobility;Decreased knowledge of use of DME;Decreased knowledge of precautions;Pain PT Treatment Interventions: DME instruction;Gait training;Stair training;Functional mobility training;Therapeutic exercise;Patient/family education     PT Goals(Current goals can be found in the care plan section) Acute Rehab PT Goals Patient Stated Goal: To go home tomorrow PT Goal Formulation: With patient/family Time For Goal Achievement: 11/16/13 Potential to Achieve Goals: Good  Visit Information  Last PT Received On: 11/09/13 Assistance Needed: +1 History of Present Illness: Patient is a 63 yo male s/p Rt TKA       Prior Functioning  Home Living Family/patient expects to be discharged to:: Private residence Living Arrangements: Spouse/significant other Available Help at Discharge: Family;Available 24 hours/day Type of Home: House Home Access: Stairs to enter [employer] of Steps: 3 Entrance Stairs-Rails: Right;Left Home Layout: Two level (split level - 3 steps up to bedroom) Alternate Level Stairs-Number of Steps: 3 Alternate Level Stairs-Rails: Right;Left Home Equipment: Walker - 2 wheels;Bedside commode Prior Function Level of Independence: Independent Communication Communication: No difficulties    Cognition  Cognition Arousal/Alertness: Awake/alert Behavior During Therapy: WFL for tasks assessed/performed Overall Cognitive Status: Within Functional Limits for tasks assessed    Extremity/Trunk Assessment Upper Extremity Assessment Upper Extremity Assessment: Overall WFL for  tasks assessed Lower Extremity Assessment Lower Extremity Assessment: RLE deficits/detail RLE Deficits / Details: Decreased strength and ROM due to surgery   Balance  End of Session PT - End of Session Equipment Utilized During Treatment: Gait belt Activity Tolerance: Patient tolerated treatment well Patient left: in chair;with call bell/phone within reach;with family/visitor present Nurse Communication: Mobility status CPM Right Knee CPM Right Knee: Off  GP     Vena AustriaDavis, Bartosz Luginbill H 11/09/2013, 5:10 PM  Durenda HurtSusan H. Renaldo Fiddleravis, PT, [facility] Acute Rehab Services Pager [phone]

## 2013-11-09 NOTE — Anesthesia Preprocedure Evaluation (Signed)
Anesthesia Evaluation  Patient identified by MRN, date of birth, ID band Patient awake    Reviewed: Allergy & Precautions, H&P , NPO status , Patient's Chart, lab work & pertinent test results  History of Anesthesia Complications (+) PONV  Airway       Dental   Pulmonary          Cardiovascular hypertension,     Neuro/Psych  Neuromuscular disease    GI/Hepatic   Endo/Other    Renal/GU      Musculoskeletal   Abdominal   Peds  Hematology   Anesthesia Other Findings   Reproductive/Obstetrics                           Anesthesia Physical Anesthesia Plan  ASA: II  Anesthesia Plan: General   Post-op Pain Management:    Induction: Intravenous  Airway Management Planned: Oral ETT and LMA  Additional Equipment:   Intra-op Plan:   Post-operative Plan: Extubation in OR  Informed Consent: I have reviewed the patients History and Physical, chart, labs and discussed the procedure including the risks, benefits and alternatives for the proposed anesthesia with the patient or authorized representative who has indicated his/her understanding and acceptance.     Plan Discussed with:   Anesthesia Plan Comments:         Anesthesia Quick Evaluation

## 2013-11-10 ENCOUNTER — Encounter (HOSPITAL_COMMUNITY): Payer: Self-pay | Admitting: *Deleted

## 2013-11-10 LAB — BASIC METABOLIC PANEL
BUN: 16 mg/dL (ref 6–23)
CALCIUM: 8.2 mg/dL — AB (ref 8.4–10.5)
CHLORIDE: 95 meq/L — AB (ref 96–112)
CO2: 26 meq/L (ref 19–32)
Creatinine, Ser: 1.06 mg/dL (ref 0.50–1.35)
GFR calc Af Amer: 85 mL/min — ABNORMAL LOW (ref >90)
GFR calc non Af Amer: 73 mL/min — ABNORMAL LOW (ref >90)
GLUCOSE: 137 mg/dL — AB (ref 70–99)
Potassium: 3.9 mEq/L (ref 3.7–5.3)
Sodium: 133 mEq/L — ABNORMAL LOW (ref 137–147)

## 2013-11-10 LAB — CBC
HCT: 36.8 % — ABNORMAL LOW (ref 39.0–52.0)
HEMOGLOBIN: 12.9 g/dL — AB (ref 13.0–17.0)
MCH: 32.3 pg (ref 26.0–34.0)
MCHC: 35.1 g/dL (ref 30.0–36.0)
MCV: 92.2 fL (ref 78.0–100.0)
PLATELETS: 171 10*3/uL (ref 150–400)
RBC: 3.99 MIL/uL — AB (ref 4.22–5.81)
RDW: 12.8 % (ref 11.5–15.5)
WBC: 9.7 10*3/uL (ref 4.0–10.5)

## 2013-11-10 MED ORDER — ENOXAPARIN SODIUM 40 MG/0.4ML ~~LOC~~ SOLN: 40.0000 mg | SUBCUTANEOUS | Status: DC

## 2013-11-10 MED ORDER — METHOCARBAMOL 500 MG PO TABS: 500.0000 mg | ORAL_TABLET | Freq: Four times a day (QID) | ORAL | Status: DC | PRN

## 2013-11-10 MED ORDER — OXYCODONE HCL 5 MG PO TABS: 5.0000 mg | ORAL_TABLET | ORAL | Status: DC | PRN

## 2013-11-10 MED ORDER — OXYCODONE HCL ER 10 MG PO T12A: 10.0000 mg | EXTENDED_RELEASE_TABLET | Freq: Two times a day (BID) | ORAL | Status: DC

## 2013-11-10 NOTE — Plan of Care (Signed)
Problem: Consults Goal: Diagnosis- Total Joint Replacement Primary Total Knee     

## 2013-11-10 NOTE — Progress Notes (Signed)
Physical Therapy Treatment Patient Details Name: Daniel RocherJerry W Rampy MRN: [id number] DOB: 1950/10/18 Today's Date: 11/10/2013 Time: [id number] PT Time Calculation (min): 26 min  PT Assessment / Plan / Recommendation  History of Present Illness Patient is a 63 yo male s/p Rt TKA   PT Comments   Continuing progress and good knee control with step-through gait  Follow Up Recommendations  Home health PT;Supervision/Assistance - 24 hour     Does the patient have the potential to tolerate intense rehabilitation     Barriers to Discharge        Equipment Recommendations  None recommended by PT    Recommendations for Other Services    Frequency 7X/week   Progress towards PT Goals Progress towards PT goals: Progressing toward goals  Plan Current plan remains appropriate    Precautions / Restrictions Precautions Precautions: Knee Precaution Comments: Provided education on precautions to patient and wife Restrictions RLE Weight Bearing: Weight bearing as tolerated   Pertinent Vitals/Pain 6/10 R Knee post therex patient repositioned for comfort and Optimal knee extension     Mobility  Bed Mobility Overal bed mobility: Needs Assistance Bed Mobility: Supine to Sit Supine to sit: Min guard General bed mobility comments: Verbal cues for technique.  Assist to move RLE off of bed.  Good sitting balance once upright. Transfers Overall transfer level: Needs assistance Equipment used: Rolling walker (2 wheeled) Transfers: Sit to/from Stand Sit to Stand: Min guard General transfer comment: Verbal cues for hand placement and use of RW.  Assist to rise to standing and for balance. Ambulation/Gait Ambulation/Gait assistance: Supervision Ambulation Distance (Feet): 200 Feet Assistive device: Rolling walker (2 wheeled) Gait Pattern/deviations: Step-through pattern General Gait Details: Cues for R quad activation for stance stability; Noted improved R knee stability    Exercises Total Joint  Exercises Quad Sets: AROM;Right;10 reps Short Arc Quad: AROM;Right;10 reps Heel Slides: AROM;AAROM;Right;10 reps Straight Leg Raises: AAROM;AROM;Right;10 reps   PT Diagnosis:    PT Problem List:   PT Treatment Interventions:     PT Goals (current goals can now be found in the care plan section) Acute Rehab PT Goals Patient Stated Goal: home today PT Goal Formulation: With patient/family Time For Goal Achievement: 11/16/13 Potential to Achieve Goals: Good  Visit Information  Last PT Received On: 11/10/13 Assistance Needed: +1 History of Present Illness: Patient is a 63 yo male s/p Rt TKA    Subjective Data  Patient Stated Goal: home today   Cognition  Cognition Arousal/Alertness: Awake/alert Behavior During Therapy: WFL for tasks assessed/performed Overall Cognitive Status: Within Functional Limits for tasks assessed    Balance     End of Session PT - End of Session Activity Tolerance: Patient tolerated treatment well Patient left: with call bell/phone within reach;with nursing/sitter in room (in bathroom) Nurse Communication: Mobility status   GP     Van ClinesGarrigan, Rip Hawes Hamff 11/10/2013, 4:35 PM  Van ClinesHolly Kalem Rockwell, PT  Acute Rehabilitation Services Pager [phone] Office [phone]

## 2013-11-10 NOTE — Progress Notes (Signed)
Physical Therapy Treatment Patient Details Name: Grier RocherJerry W Malmquist MRN: [id number] DOB: May 19, 1951 Today's Date: 11/10/2013 Time: [id number] PT Time Calculation (min): 43 min  PT Assessment / Plan / Recommendation  History of Present Illness Patient is a 63 yo male s/p Rt TKA   PT Comments   Excellent progress with functional mobility; stair training complete; ok for dc home from PT standpoint  Follow Up Recommendations  Home health PT;Supervision/Assistance - 24 hour     Does the patient have the potential to tolerate intense rehabilitation     Barriers to Discharge        Equipment Recommendations  None recommended by PT    Recommendations for Other Services    Frequency 7X/week   Progress towards PT Goals Progress towards PT goals: Progressing toward goals  Plan Current plan remains appropriate    Precautions / Restrictions Precautions Precautions: Knee Precaution Comments: Provided education on precautions to patient and wife Restrictions Weight Bearing Restrictions: Yes RLE Weight Bearing: Weight bearing as tolerated   Pertinent Vitals/Pain 5/10 R knee RN provided medication to assist with pain control     Mobility  Bed Mobility Overal bed mobility: Needs Assistance Bed Mobility: Supine to Sit Supine to sit: Min assist General bed mobility comments: Verbal cues for technique.  Assist to move RLE off of bed.  Good sitting balance once upright. Transfers Overall transfer level: Needs assistance Equipment used: Rolling walker (2 wheeled) Transfers: Sit to/from Stand Sit to Stand: Min guard General transfer comment: Verbal cues for hand placement and use of RW.  Assist to rise to standing and for balance. Ambulation/Gait Ambulation/Gait assistance: Supervision Ambulation Distance (Feet): 490 Feet Assistive device: Rolling walker (2 wheeled) Gait Pattern/deviations: Step-through pattern (emerging) General Gait Details: Cues for R quad activation for stance  stability Stairs: Yes Stairs assistance: Min guard Stair Management: One rail Right;One rail Left;Step to pattern;Sideways Number of Stairs: 5 General stair comments: Verbal and demo cues for sequence    Exercises     PT Diagnosis:    PT Problem List:   PT Treatment Interventions:     PT Goals (current goals can now be found in the care plan section) Acute Rehab PT Goals Patient Stated Goal: home today PT Goal Formulation: With patient/family Time For Goal Achievement: 11/16/13 Potential to Achieve Goals: Good  Visit Information  Last PT Received On: 11/10/13 Assistance Needed: +1 History of Present Illness: Patient is a [age] yo male s/p Rt TKA    Subjective Data  Patient Stated Goal: home today   Cognition  Cognition Arousal/Alertness: Awake/alert Behavior During Therapy: WFL for tasks assessed/performed Overall Cognitive Status: Within Functional Limits for tasks assessed    Balance     End of Session PT - End of Session Activity Tolerance: Patient tolerated treatment well Patient left: with call bell/phone within reach;with family/visitor present;with nursing/sitter in room (in bathroom) Nurse Communication: Mobility status CPM Right Knee CPM Right Knee: Off   GP     Van ClinesGarrigan, Shayleen Eppinger Hamff 11/10/2013, 11:49 AM Van ClinesHolly Dmitriy Gair, PT  Acute Rehabilitation Services Pager [phone] Office [phone]

## 2013-11-10 NOTE — Care Management Note (Signed)
CARE MANAGEMENT NOTE 11/10/2013  Patient:  Grier RocherKIDD,[place] W   Account Number:  [id number]  Date Initiated:  11/09/2013  Documentation initiated by:  Vance PeperBRADY,Story Vanvranken  Subjective/Objective Assessment:   63 yr old male s/p right total knee arthroplasty.     Action/Plan:   Case manager spoke with patient and wife. [facility] no changes. Has family support at discharge.   Anticipated DC Date:  11/10/2013   Anticipated DC Plan:  HOME W HOME HEALTH SERVICES      DC Planning Services  CM consult      [facility]  DURABLE MEDICAL EQUIPMENT   Choice offered to / List presented to:  C-1 Patient   DME arranged  3-N-1  WALKER - ROLLING  CPM      DME agency  [employer]     HH arranged  HH-2 PT      [facility]   Status of service:  Completed, signed off Medicare Important Message given?   (If response is "NO", the following Medicare IM given date fields will be blank) Date Medicare IM given:   Date Additional Medicare IM given:    Discharge Disposition:  HOME W HOME HEALTH SERVICES  Per UR Regulation:

## 2013-11-10 NOTE — Op Note (Signed)
TOTAL KNEE REPLACEMENT OPERATIVE NOTE:  11/09/2013  1:59 PM  PATIENT:  Daniel Johns  63 y.o. male  PRE-OPERATIVE DIAGNOSIS:  osteoarthritis right knee  POST-OPERATIVE DIAGNOSIS:  osteoarthritis right knee  PROCEDURE:  Procedure(s): TOTAL KNEE ARTHROPLASTY  SURGEON:  Surgeon(s): Dannielle Huh, MD  PHYSICIAN ASSISTANT: Altamese Cabal, [facility]  ANESTHESIA:   general  DRAINS: Hemovac  SPECIMEN: None  COUNTS:  Correct  TOURNIQUET:   Total Tourniquet Time Documented: Thigh (Right) - 62 minutes Total: Thigh (Right) - 62 minutes   DICTATION:  Indication for procedure:    The patient is a 63 y.o. male who has failed conservative treatment for osteoarthritis right knee.  Informed consent was obtained prior to anesthesia. The risks versus benefits of the operation were explain and in a way the patient can, and did, understand.   On the implant demand matching protocol, this patient scored 15.  Therefore, this patient was receive a polyethylene insert with vitamin E which is a high demand implant.  Description of procedure:     The patient was taken to the operating room and placed under anesthesia.  The patient was positioned in the usual fashion taking care that all body parts were adequately padded and/or protected.  I foley catheter was not placed.  A tourniquet was applied and the leg prepped and draped in the usual sterile fashion.  The extremity was exsanguinated with the esmarch and tourniquet inflated to 350 mmHg.  Pre-operative range of motion was normal.  The knee was in 5 degree of mild varus.  A midline incision approximately 6-7 inches long was made with a #10 blade.  A new blade was used to make a parapatellar arthrotomy going 2-3 cm into the quadriceps tendon, over the patella, and alongside the medial aspect of the patellar tendon.  A synovectomy was then performed with the #10 blade and forceps. I then elevated the deep MCL off the medial tibial metaphysis subperiosteally  around to the semimembranosus attachment.    I everted the patella and used calipers to measure patellar thickness.  I used the reamer to ream down to appropriate thickness to recreate the native thickness.  I then removed excess bone with the rongeur and sagittal saw.  I used the appropriately sized template and drilled the three lug holes.  I then put the trial in place and measured the thickness with the calipers to ensure recreation of the native thickness.  The trial was then removed and the patella subluxed and the knee brought into flexion.  A homan retractor was place to retract and protect the patella and lateral structures.  A Z-retractor was place medially to protect the medial structures.  The extra-medullary alignment system was used to make cut the tibial articular surface perpendicular to the anamotic axis of the tibia and in 3 degrees of posterior slope.  The cut surface and alignment jig was removed.  I then used the intramedullary alignment guide to make a 6 valgus cut on the distal femur.  I then marked out the epicondylar axis on the distal femur.  The posterior condylar axis measured 3 degrees.  I then used the anterior referencing sizer and measured the femur to be a size 10.  The 4-In-1 cutting block was screwed into place in external rotation matching the posterior condylar angle, making our cuts perpendicular to the epicondylar axis.  Anterior, posterior and chamfer cuts were made with the sagittal saw.  The cutting block and cut pieces were removed.  A lamina  spreader was placed in 90 degrees of flexion.  The ACL, PCL, menisci, and posterior condylar osteophytes were removed.  A 10 mm spacer blocked was found to offer good flexion and extension gap balance after minimal in degree releasing.   The scoop retractor was then placed and the femoral finishing block was pinned in place.  The small sagittal saw was used as well as the lug drill to finish the femur.  The block and cut  surfaces were removed and the medullary canal hole filled with autograft bone from the cut pieces.  The tibia was delivered forward in deep flexion and external rotation.  A size F tray was selected and pinned into place centered on the medial 1/3 of the tibial tubercle.  The reamer and keel was used to prepare the tibia through the tray.    I then trialed with the size 10 femur, size F tibia, a 10 mm insert and the 35 patella.  I had excellent flexion/extension gap balance, excellent patella tracking.  Flexion was full and beyond 120 degrees; extension was zero.  These components were chosen and the staff opened them to me on the back table while the knee was lavaged copiously and the cement mixed.  The soft tissue was infiltrated with 60cc of exparel 1.3% through a 21 gauge needle.  I cemented in the components and removed all excess cement.  The polyethylene tibial component was snapped into place and the knee placed in extension while cement was hardening.  The capsule was infilltrated with 30cc of .25% Marcaine with epinephrine.  A hemovac was place in the joint exiting superolaterally.  A pain pump was place superomedially superficial to the arthrotomy.  Once the cement was hard, the tourniquet was let down.  Hemostasis was obtained.  The arthrotomy was closed with figure-8 #1 vicryl sutures.  The deep soft tissues were closed with #0 vicryls and the subcuticular layer closed with a running #2-0 vicryl.  The skin was reapproximated and closed with skin staples.  The wound was dressed with xeroform, 4 x4's, 2 ABD sponges, a single layer of webril and a TED stocking.   The patient was then awakened, extubated, and taken to the recovery room in stable condition.  BLOOD LOSS:  300cc DRAINS: 1 hemovac, 1 pain catheter COMPLICATIONS:  None.  PLAN OF CARE: Admit to inpatient   PATIENT DISPOSITION:  PACU - hemodynamically stable.   Delay start of Pharmacological VTE agent (>24hrs) due to surgical  blood loss or risk of bleeding:  not applicable  Please fax a copy of this op note to my office at [phone] (please only include page 1 and 2 of the Case Information op note)

## 2013-11-10 NOTE — Discharge Instructions (Signed)
Diet: As you were doing prior to hospitalization  ° °Activity:  Increase activity slowly as tolerated  °                No lifting or driving for 6 weeks ° °Shower:  May shower without a dressing once there is no drainage from your wound. °                Do NOT wash over the wound. °                °Dressing:  You may change your dressing on Wednesday °                   Then change the dressing daily with sterile 4"x4"s gauze dressing  °                   And TED hose for knees. ° °Weight Bearing:  Weight bearing as tolerated as taught in physical therapy.  Use a                                walker or Crutches as instructed. ° °To prevent constipation: you may use a stool softener such as - °              Colace ( over the counter) 100 mg by mouth twice a day  °              Drink plenty of fluids ( prune juice may be helpful) and high fiber foods °               Miralax ( over the counter) for constipation as needed.   ° °Precautions:  If you experience chest pain or shortness of breath - call 911 immediately               For transfer to the hospital emergency department!! °              If you develop a fever greater that 101 F, purulent drainage from wound,                             increased redness or drainage from wound, or calf pain -- Call the office. ° °Follow- Up Appointment:  Please call for an appointment to be seen on 11/24/13 °                                             [place] office:  [phone] °           [address] [place], [place] [address] °               ° ° °

## 2013-11-10 NOTE — Progress Notes (Signed)
Orthopedic Tech Progress Note Patient Details:  Daniel Johns July 26, 1951 [id number] Footsie roll delivered to patient Ortho Devices Type of Ortho Device: Other (comment) Ortho Device/Splint Interventions: Ordered   Asia R Thompson 11/10/2013, 1:20 PM

## 2013-11-10 NOTE — Progress Notes (Signed)
Occupational Therapy Evaluation and Discharge Patient Details Name: Daniel RocherJerry W Heiny MRN: [id number] DOB: [birth year] Today's Date: 11/10/2013 Time: [id number] OT Time Calculation (min): 21 min  OT Assessment / Plan / Recommendation History of present illness Patient is a 63 yo male s/p Rt TKA   Clinical Impression   PTA pt lived at home with wife and was Independent in ADLs and mobility. Education and training completed regarding compensatory techniques for LB dressing and bathing. Pt performed toilet and walk-in shower transfer to practice for home environment with Min Guard assist. Pt states that his wife will be home to help with ADLs for the first week. Pt ready for D/C from OT standpoint.     OT Assessment  Patient does not need any further OT services    Follow Up Recommendations  No OT follow up       Equipment Recommendations  None recommended by OT          Precautions / Restrictions Precautions Precautions: Knee Restrictions Weight Bearing Restrictions: Yes RLE Weight Bearing: Weight bearing as tolerated   Pertinent Vitals/Pain Does not c/o pain as he received pain medication about 1 hour ago    ADL  Eating/Feeding: Independent Where Assessed - Eating/Feeding: Chair Grooming: Set up Where Assessed - Grooming: Unsupported standing Upper Body Bathing: Independent Where Assessed - Upper Body Bathing: Unsupported sitting Lower Body Bathing: Minimal assistance Where Assessed - Lower Body Bathing: Unsupported sit to stand Upper Body Dressing: Independent Where Assessed - Upper Body Dressing: Unsupported sitting Lower Body Dressing: Minimal assistance Where Assessed - Lower Body Dressing: Unsupported sit to stand Toilet Transfer: Min guard Toilet Transfer Method: Sit to [place] Toilet Transfer Equipment: Raised toilet seat with arms (or 3-in-1 over toilet) Toileting - Clothing Manipulation and Hygiene: Supervision/safety Where Assessed - Engineer, miningToileting Clothing Manipulation  and Hygiene: Sit to stand from 3-in-1 or toilet Tub/Shower Transfer: Min guard Tub/Shower Transfer Method: Science writerAmbulating Tub/Shower Transfer Equipment: Walk in shower;Other (comment) (3 in 1) Equipment Used: Gait belt;Rolling walker        Visit Information  Last OT Received On: 11/10/13 Assistance Needed: +1 History of Present Illness: Patient is a 63 yo male s/p Rt TKA       Prior Functioning     Home Living Family/patient expects to be discharged to:: Private residence Living Arrangements: Spouse/significant other Available Help at Discharge: Family;Available 24 hours/day Type of Home: House Home Access: Stairs to enter Entergy CorporationEntrance Stairs-Number of Steps: 3 Entrance Stairs-Rails: Right;Left Home Layout: Two level (split level- 3 steps to bedroom) Alternate Level Stairs-Number of Steps: 3 Alternate Level Stairs-Rails: Right;Left Home Equipment: Walker - 2 wheels;Bedside commode;Shower seat - built in Prior Function Level of Independence: Independent Communication Communication: No difficulties Dominant Hand: Right         Vision/Perception Vision - History Patient Visual Report: No change from baseline   Cognition  Cognition Arousal/Alertness: Awake/alert Behavior During Therapy: WFL for tasks assessed/performed Overall Cognitive Status: Within Functional Limits for tasks assessed    Extremity/Trunk Assessment Upper Extremity Assessment Upper Extremity Assessment: Overall WFL for tasks assessed     Mobility Transfers Overall transfer level: Needs assistance Equipment used: Rolling walker (2 wheeled) Transfers: Sit to/from Stand Sit to Stand: Min guard           End of Session OT - End of Session Equipment Utilized During Treatment: Gait belt;Rolling walker Activity Tolerance: Patient tolerated treatment well Patient left: in chair;with call bell/phone within reach CPM Right Knee CPM Right Knee: Off  Rae Lips [phone] 11/10/2013,  11:26 AM

## 2013-11-10 NOTE — Anesthesia Postprocedure Evaluation (Signed)
  Anesthesia Post-op Note  Patient: Daniel Johns  Procedure(s) Performed: Procedure(s): TOTAL KNEE ARTHROPLASTY (Right)  Patient Location: PACU  Anesthesia Type:General and GA combined with regional for post-op pain  Level of Consciousness: awake, alert , oriented and patient cooperative  Airway and Oxygen Therapy: Patient Spontanous Breathing  Post-op Pain: mild  Post-op Assessment: Post-op Vital signs reviewed, Patient's Cardiovascular Status Stable, Respiratory Function Stable, Patent Airway, No signs of Nausea or vomiting and Pain level controlled  Post-op Vital Signs: stable  Complications: No apparent anesthesia complications

## 2013-11-10 NOTE — Progress Notes (Signed)
Pt discharged to home. D/c instruction given. No questions verbalized. Vitals stable.

## 2013-11-10 NOTE — Progress Notes (Signed)
SPORTS MEDICINE AND JOINT REPLACEMENT  Georgena Spurling, MD   Altamese Cabal, PA-C [address] [place], [place], [place]  [address]                             [phone]   PROGRESS NOTE  Subjective:  negative for Chest Pain  negative for Shortness of Breath  positive for Nausea/Vomiting   negative for Calf Pain  negative for Bowel Movement   Tolerating Diet: yes         Patient reports pain as 5 on 0-10 scale.    Objective: Vital signs in last 24 hours:   Patient Vitals for the past 24 hrs:  BP Temp Temp src Pulse Resp SpO2 Height Weight  11/10/13 0800 - - - - 16 94 % - -  11/10/13 0505 120/60 mmHg 98.9 F (37.2 C) Oral 105 16 91 % - -  11/10/13 0353 - - - - 14 93 % - -  11/10/13 0232 - - - - - - 5\' 11"  (1.803 m) 103.42 kg (228 lb)  11/10/13 0000 - - - - 16 92 % - -  11/09/13 2100 122/77 mmHg 98.8 F (37.1 C) Oral 93 16 90 % - -  11/09/13 2000 - - - - 18 92 % - -  11/09/13 1115 138/78 mmHg 98.1 F (36.7 C) Oral 98 18 98 % - -  11/09/13 1112 - 97.9 F (36.6 C) - - - - - -  11/09/13 1108 138/97 mmHg - - 91 17 97 % - -  11/09/13 1100 - - - 85 12 95 % - -  11/09/13 1053 134/95 mmHg - - 97 17 95 % - -  11/09/13 1045 - - - 87 13 95 % - -  11/09/13 1038 130/87 mmHg - - 86 13 94 % - -  11/09/13 1030 - - - 88 17 96 % - -  11/09/13 1023 132/85 mmHg - - 84 11 96 % - -  11/09/13 1015 - - - 81 13 96 % - -  11/09/13 1008 129/84 mmHg - - 79 13 95 % - -  11/09/13 1000 - - - 84 16 94 % - -  11/09/13 0953 117/77 mmHg - - 73 12 95 % - -  11/09/13 0945 - - - 79 13 94 % - -  11/09/13 0940 113/77 mmHg 98.7 F (37.1 C) - 73 13 93 % - -    @flow {1959:LAST@   Intake/Output from previous day:   03/09 0701 - 03/10 0700 In: 2130 [P.O.:480; I.V.:1650] Out: 1300 [Urine:750; Drains:450]   Intake/Output this shift:       Intake/Output     03/09 0701 - 03/10 0700 03/10 0701 - 03/11 0700   P.O. 480    I.V. (mL/kg) 1650 (16)    Total Intake(mL/kg) 2130 (20.6)    Urine (mL/kg/hr) 750  (0.3)    Drains 450 (0.2)    Blood 100 (0)    Total Output 1300     Net +830             LABORATORY DATA:  Recent Labs  11/09/13 1315 11/10/13 0410  WBC 9.0 9.7  HGB 15.2 12.9*  HCT 42.5 36.8*  PLT 187 171    Recent Labs  11/09/13 1315 11/10/13 0410  NA  --  133*  K  --  3.9  CL  --  95*  CO2  --  26  BUN  --  16  CREATININE 0.94 1.06  GLUCOSE  --  137*  CALCIUM  --  8.2*   Lab Results  Component Value Date   INR 1.05 11/02/2013    Examination:  General appearance: alert, appears stated age and no distress Extremities: extremities normal, atraumatic, no cyanosis or edema  Wound Exam: clean, dry, intact   Drainage:  Scant/small amount Serosanguinous exudate  Motor Exam: EHL and FHL Intact  Sensory Exam: Deep Peroneal normal   Assessment:    1 Day Post-Op  Procedure(s) (LRB): TOTAL KNEE ARTHROPLASTY (Right)  ADDITIONAL DIAGNOSIS:  Active Problems:   S/P total knee arthroplasty  Acute Blood Loss Anemia   Plan: Physical Therapy as ordered Weight Bearing as Tolerated (WBAT)  DVT Prophylaxis:  Lovenox  DISCHARGE PLAN: Home  DISCHARGE NEEDS: HHPT, CPM, Walker and 3-in-1 comode seat         Daniel Johns 11/10/2013, 9:25 AM

## 2013-11-16 NOTE — Discharge Summary (Signed)
SPORTS MEDICINE & JOINT REPLACEMENT   Georgena SpurlingStephen Lucey, MD   Altamese CabalMaurice Tanna Loeffler, PA-C [address], [place], [place]  [id number]                             [phone]  PATIENT ID: Daniel Johns        MRN:  [id number]          DOB/AGE: 12-15-1950 / 63 y.o.    DISCHARGE SUMMARY  ADMISSION DATE:    11/09/2013 DISCHARGE DATE:   11/10/13   ADMISSION DIAGNOSIS: osteoarthritis right knee    DISCHARGE DIAGNOSIS:  osteoarthritis right knee    ADDITIONAL DIAGNOSIS: Active Problems:   S/P total knee arthroplasty  Past Medical History  Diagnosis Date  . Seasonal allergies     takes Zyrtec daily and Afrin nasal spray daily as needed for congestion  . Joint pain     takes Hydrocodone as needed and Ibuprofen as well if needed  . Arthritis   . PONV (postoperative nausea and vomiting)     slow to wake up  . Hypertension     takes Lisinopril and HCTZ daily  . Joint swelling   . Sciatica     occaionally  . History of gout     PROCEDURE: Procedure(s): TOTAL KNEE ARTHROPLASTY on 11/09/2013  CONSULTS:     HISTORY:  See H&P in chart  HOSPITAL COURSE:  Daniel Johns is a 63 y.o. admitted on 11/09/2013 and found to have a diagnosis of osteoarthritis right knee.  After appropriate laboratory studies were obtained  they were taken to the operating room on 11/09/2013 and underwent Procedure(s): TOTAL KNEE ARTHROPLASTY.   They were given perioperative antibiotics:  Anti-infectives   Start     Dose/Rate Route Frequency Ordered Stop   11/09/13 1400  ceFAZolin (ANCEF) IVPB 2 g/50 mL premix     2 g 100 mL/hr over 30 Minutes Intravenous Every 6 hours 11/09/13 1135 11/10/13 0159   11/09/13 0600  ceFAZolin (ANCEF) 3 g in dextrose 5 % 50 mL IVPB     3 g 160 mL/hr over 30 Minutes Intravenous On call to O.R. 11/08/13 1422 11/09/13 0745    .  Tolerated the procedure well.  Placed with a foley intraoperatively.  Given Ofirmev at induction and for 48 hours.    POD# 1: Vital signs were stable.  Patient  denied Chest pain, shortness of breath, or calf pain.  Patient was started on Lovenox 30 mg subcutaneously twice daily at 8am.  Consults to PT, OT, and care management were made.  The patient was weight bearing as tolerated.  CPM was placed on the operative leg 0-90 degrees for 6-8 hours a day.  Incentive spirometry was taught.  Dressing was changed.  Marcaine pump and hemovac were discontinued.      POD #2, Continued  PT for ambulation and exercise program.  IV saline locked.  O2 discontinued.    The remainder of the hospital course was dedicated to ambulation and strengthening.   The patient was discharged on day 1 post op in  Good condition.  Blood products given:none  DIAGNOSTIC STUDIES: Recent vital signs: No data found.      Recent laboratory studies:  Recent Labs  11/09/13 1315 11/10/13 0410  WBC 9.0 9.7  HGB 15.2 12.9*  HCT 42.5 36.8*  PLT 187 171    Recent Labs  11/09/13 1315 11/10/13 0410  NA  --  133*  K  --  3.9  CL  --  95*  CO2  --  26  BUN  --  16  CREATININE 0.94 1.06  GLUCOSE  --  137*  CALCIUM  --  8.2*   Lab Results  Component Value Date   INR 1.05 11/02/2013     Recent Radiographic Studies :  Dg Chest 2 View  11/02/2013   CLINICAL DATA:  preop  EXAM: CHEST  2 VIEW  COMPARISON:  None.  FINDINGS: The heart size and mediastinal contours are within normal limits. Both lungs are clear. The visualized skeletal structures are unremarkable.  IMPRESSION: No active cardiopulmonary disease.   Electronically Signed   By: Salome Holmes M.D.   On: 11/02/2013 10:01    DISCHARGE INSTRUCTIONS: Discharge Orders   Future Orders Complete By Expires   Call MD / Call 911  As directed    Comments:     If you experience chest pain or shortness of breath, CALL 911 and be transported to the hospital emergency room.  If you develope a fever above 101 F, pus (white drainage) or increased drainage or redness at the wound, or calf pain, call your surgeon's office.   Change  dressing  As directed    Comments:     Change dressing on Wednesday, then change the dressing daily with sterile 4 x 4 inch gauze dressing and apply TED hose.   Constipation Prevention  As directed    Comments:     Drink plenty of fluids.  Prune juice may be helpful.  You may use a stool softener, such as Colace (over the counter) 100 mg twice a day.  Use MiraLax (over the counter) for constipation as needed.   CPM  As directed    Comments:     Continuous passive motion machine (CPM):      Use the CPM from 0 to 90 for 6-8 hours per day.      You may increase by 10 per day.  You may break it up into 2 or 3 sessions per day.      Use CPM for 2 weeks or until you are told to stop.   Diet - low sodium heart healthy  As directed    Do not put a pillow under the knee. Place it under the heel.  As directed    Driving restrictions  As directed    Comments:     No driving for 6 weeks   Increase activity slowly as tolerated  As directed    Lifting restrictions  As directed    Comments:     No lifting for 6 weeks   TED hose  As directed    Comments:     Use stockings (TED hose) for 3 weeks on both leg(s).  You may remove them at night for sleeping.      DISCHARGE MEDICATIONS:     Medication List    STOP taking these medications       aspirin EC 81 MG tablet     HYDROcodone-acetaminophen 5-325 MG per tablet  Commonly known as:  NORCO/VICODIN      TAKE these medications       cetirizine 10 MG tablet  Commonly known as:  ZYRTEC  Take 10 mg by mouth daily.     enoxaparin 40 MG/0.4ML injection  Commonly known as:  LOVENOX  Inject 0.4 mLs (40 mg total) into the skin daily.     hydrochlorothiazide 25 MG  tablet  Commonly known as:  HYDRODIURIL  Take 25 mg by mouth daily.     ibuprofen 200 MG tablet  Commonly known as:  ADVIL,MOTRIN  Take 200 mg by mouth every 6 (six) hours as needed for moderate pain.     lisinopril 40 MG tablet  Commonly known as:  PRINIVIL,ZESTRIL  Take 40  mg by mouth daily.     meloxicam 15 MG tablet  Commonly known as:  MOBIC  Take 15 mg by mouth daily.     methocarbamol 500 MG tablet  Commonly known as:  ROBAXIN  Take 1-2 tablets (500-1,000 mg total) by mouth every 6 (six) hours as needed for muscle spasms.     naproxen sodium 220 MG tablet  Commonly known as:  ANAPROX  Take 220 mg by mouth daily as needed (pain).     oxyCODONE 5 MG immediate release tablet  Commonly known as:  Oxy IR/ROXICODONE  Take 1-2 tablets (5-10 mg total) by mouth every 4 (four) hours as needed for breakthrough pain.     OxyCODONE 10 mg T12a 12 hr tablet  Commonly known as:  OXYCONTIN  Take 1 tablet (10 mg total) by mouth every 12 (twelve) hours.     oxymetazoline 0.05 % nasal spray  Commonly known as:  AFRIN  Place 1 spray into both nostrils 2 (two) times daily as needed for congestion.        FOLLOW UP VISIT:       Follow-up Information   Follow up with Raymon Mutton, MD. Call on 11/24/2013.   Specialty:  Orthopedic Surgery   Contact information:   [address] [place] [address] [phone]       DISPOSITION: HOME   CONDITION:  Good   Daniel Johns 11/16/2013, 9:24 AM

## 2014-06-02 ENCOUNTER — Other Ambulatory Visit: Payer: Self-pay | Admitting: Orthopedic Surgery

## 2014-06-02 DIAGNOSIS — M2392 Unspecified internal derangement of left knee: Secondary | ICD-10-CM

## 2014-06-07 ENCOUNTER — Other Ambulatory Visit: Payer: Self-pay | Admitting: Orthopedic Surgery

## 2014-06-07 DIAGNOSIS — M5441 Lumbago with sciatica, right side: Secondary | ICD-10-CM

## 2014-06-09 ENCOUNTER — Inpatient Hospital Stay: Admission: RE | Admit: 2014-06-09 | Payer: 59 | Source: Ambulatory Visit

## 2014-06-10 ENCOUNTER — Ambulatory Visit
Admission: RE | Admit: 2014-06-10 | Discharge: 2014-06-10 | Disposition: A | Discharge status: Home / Self Care | Payer: 59 | Source: Ambulatory Visit | Attending: Orthopedic Surgery | Admitting: Orthopedic Surgery

## 2014-06-10 DIAGNOSIS — M5441 Lumbago with sciatica, right side: Secondary | ICD-10-CM

## 2015-09-04 HISTORY — PX: JOINT REPLACEMENT: SHX530

## 2017-01-06 ENCOUNTER — Emergency Department (HOSPITAL_COMMUNITY)
Admission: EM | Admit: 2017-01-06 | Discharge: 2017-01-07 | Disposition: A | Discharge status: Home / Self Care | Payer: Medicare Other | Attending: Emergency Medicine | Admitting: Emergency Medicine

## 2017-01-06 ENCOUNTER — Encounter (HOSPITAL_COMMUNITY): Payer: Self-pay

## 2017-01-06 ENCOUNTER — Emergency Department (HOSPITAL_COMMUNITY): Payer: Medicare Other

## 2017-01-06 DIAGNOSIS — I10 Essential (primary) hypertension: Secondary | ICD-10-CM | POA: Insufficient documentation

## 2017-01-06 DIAGNOSIS — Z79899 Other long term (current) drug therapy: Secondary | ICD-10-CM | POA: Diagnosis not present

## 2017-01-06 DIAGNOSIS — Z96651 Presence of right artificial knee joint: Secondary | ICD-10-CM | POA: Insufficient documentation

## 2017-01-06 DIAGNOSIS — G8918 Other acute postprocedural pain: Secondary | ICD-10-CM | POA: Diagnosis present

## 2017-01-06 DIAGNOSIS — Z7901 Long term (current) use of anticoagulants: Secondary | ICD-10-CM | POA: Diagnosis not present

## 2017-01-06 DIAGNOSIS — M542 Cervicalgia: Secondary | ICD-10-CM | POA: Diagnosis not present

## 2017-01-06 LAB — I-STAT CHEM 8, ED
BUN: 24 mg/dL — ABNORMAL HIGH (ref 6–20)
CALCIUM ION: 1.03 mmol/L — AB (ref 1.15–1.40)
CREATININE: 1 mg/dL (ref 0.61–1.24)
Chloride: 98 mmol/L — ABNORMAL LOW (ref 101–111)
Glucose, Bld: 106 mg/dL — ABNORMAL HIGH (ref 65–99)
HCT: 51 % (ref 39.0–52.0)
HEMOGLOBIN: 17.3 g/dL — AB (ref 13.0–17.0)
Potassium: 3.8 mmol/L (ref 3.5–5.1)
Sodium: 135 mmol/L (ref 135–145)
TCO2: 26 mmol/L (ref 0–100)

## 2017-01-06 LAB — CBC WITH DIFFERENTIAL/PLATELET
Basophils Absolute: 0 10*3/uL (ref 0.0–0.1)
Basophils Relative: 0 %
EOS PCT: 1 %
Eosinophils Absolute: 0.1 10*3/uL (ref 0.0–0.7)
HCT: 48.7 % (ref 39.0–52.0)
Hemoglobin: 17.3 g/dL — ABNORMAL HIGH (ref 13.0–17.0)
LYMPHS ABS: 1.3 10*3/uL (ref 0.7–4.0)
Lymphocytes Relative: 14 %
MCH: 34.4 pg — AB (ref 26.0–34.0)
MCHC: 35.5 g/dL (ref 30.0–36.0)
MCV: 96.8 fL (ref 78.0–100.0)
MONOS PCT: 13 %
Monocytes Absolute: 1.2 10*3/uL — ABNORMAL HIGH (ref 0.1–1.0)
Neutro Abs: 6.8 10*3/uL (ref 1.7–7.7)
Neutrophils Relative %: 72 %
Platelets: 193 10*3/uL (ref 150–400)
RBC: 5.03 MIL/uL (ref 4.22–5.81)
RDW: 13.8 % (ref 11.5–15.5)
WBC: 9.4 10*3/uL (ref 4.0–10.5)

## 2017-01-06 MED ORDER — MORPHINE SULFATE (PF) 4 MG/ML IV SOLN
4.0000 mg | Freq: Once | INTRAVENOUS | Status: AC
  Administered 2017-01-06: 4 mg via INTRAVENOUS
  Filled 2017-01-06: qty 1

## 2017-01-06 MED ORDER — SODIUM CHLORIDE 0.9 % IV BOLUS (SEPSIS)
1000.0000 mL | Freq: Once | INTRAVENOUS | Status: AC
  Administered 2017-01-07: 1000 mL via INTRAVENOUS

## 2017-01-06 MED ORDER — IOPAMIDOL (ISOVUE-300) INJECTION 61%
INTRAVENOUS | Status: AC
  Administered 2017-01-06: 75 mL
  Filled 2017-01-06: qty 75

## 2017-01-06 MED ORDER — SODIUM CHLORIDE 0.9 % IV BOLUS (SEPSIS)
1000.0000 mL | Freq: Once | INTRAVENOUS | Status: AC
  Administered 2017-01-06: 1000 mL via INTRAVENOUS

## 2017-01-06 NOTE — ED Triage Notes (Signed)
Pt complaining of difficulty swallowing. Pt states recent cervical surgery on Thursday. Pt states increased swelling and pain to throat. Pt denies any difficulty breathing. Airway intact. Pt states Venetia MaxonStern, MD is neurosurgeon.

## 2017-01-06 NOTE — ED Provider Notes (Signed)
[facility]-EMERGENCY DEPT Provider Note   CSN: [id number] Arrival date & time: 01/06/17  2045     History   Chief Complaint Chief Complaint  Patient presents with  . Post-op Problem    HPI Daniel Johns is a 66 y.o. male.  HPI Patient presents with difficulty swallowing. On Thursday had cervical spine surgery by Dr. Venetia MaxonStern. No increased pain and difficulty swallowing. No difficulty breathing. No fevers. No cough. States he is not able to get his pain meds down. States she has vomited up the pills he tracks to take after get stuck but has not been vomiting out of the stomach. Discussed with neurosurgeon on call and told to come in for further evaluation. May need steroids. States he's not been able eat and drink much due to the difficulty swallowing.   Past Medical History:  Diagnosis Date  . Arthritis   . History of gout   . Hypertension    takes Lisinopril and HCTZ daily  . Joint pain    takes Hydrocodone as needed and Ibuprofen as well if needed  . Joint swelling   . PONV (postoperative nausea and vomiting)    slow to wake up  . Sciatica    occaionally  . Seasonal allergies    takes Zyrtec daily and Afrin nasal spray daily as needed for congestion    Patient Active Problem List   Diagnosis Date Noted  . S/P total knee arthroplasty 11/09/2013    Past Surgical History:  Procedure Laterality Date  . bone spurs removed from left shoulder    . KNEE ARTHROSCOPY     x 2  . left knee arthroscopy     x 1  . sty removed from left eye    . TONSILLECTOMY    . TOTAL KNEE ARTHROPLASTY Right 11/09/2013   Procedure: TOTAL KNEE ARTHROPLASTY;  Surgeon: Dannielle HuhSteve Lucey, MD;  Location: [facility] OR;  Service: Orthopedics;  Laterality: Right;       Home Medications    Prior to Admission medications   Medication Sig Start Date End Date Taking? Authorizing Provider  lisinopril (PRINIVIL,ZESTRIL) 40 MG tablet Take 40 mg by mouth daily.   Yes [provider]  methocarbamol (ROBAXIN) 500  MG tablet Take 1-2 tablets (500-1,000 mg total) by mouth every 6 (six) hours as needed for muscle spasms. 11/10/13  Yes Altamese CabalJones, Maurice, PA-C  oxyCODONE (OXY IR/ROXICODONE) 5 MG immediate release tablet Take 1-2 tablets (5-10 mg total) by mouth every 4 (four) hours as needed for breakthrough pain. 11/10/13  Yes Altamese CabalJones, Maurice, PA-C  enoxaparin (LOVENOX) 40 MG/0.4ML injection Inject 0.4 mLs (40 mg total) into the skin daily. Patient not taking: Reported on 01/06/2017 11/10/13   Altamese CabalJones, Maurice, PA-C  methylPREDNISolone (MEDROL DOSEPAK) 4 MG TBPK tablet Use as directed 01/07/17   Benjiman CorePickering, Baylyn Sickles, MD  OxyCODONE (OXYCONTIN) 10 mg T12A 12 hr tablet Take 1 tablet (10 mg total) by mouth every 12 (twelve) hours. Patient not taking: Reported on 01/06/2017 11/10/13   Altamese CabalJones, Maurice, PA-C    Family History History reviewed. No pertinent family history.  Social History Social History  Substance Use Topics  . Smoking status: Never Smoker  . Smokeless tobacco: Never Used  . Alcohol use Yes     Comment: daily beer     Allergies   Patient has no known allergies.   Review of Systems Review of Systems  Constitutional: Negative for appetite change.  HENT: Positive for trouble swallowing. Negative for dental problem, facial swelling and voice  change.   Respiratory: Negative for shortness of breath.   Gastrointestinal: Negative for abdominal pain.  Genitourinary: Negative for flank pain.  Musculoskeletal: Positive for neck pain.  Skin: Positive for wound.  Neurological: Negative for numbness.  Hematological: Negative for adenopathy.  Psychiatric/Behavioral: Negative for confusion.     Physical Exam Updated Vital Signs BP 118/83   Pulse (!) 106   Temp 99.6 F (37.6 C) (Oral)   Resp 16   SpO2 94%   Physical Exam  Constitutional: He appears well-developed.  Eyes: Pupils are equal, round, and reactive to light.  Neck:  Left anterior lower neck wound from previous recent surgery. Slight swelling of  the neck. Trachea midline. No clear hematoma. No subcutaneous emphysema. Cervical collar in place.  Cardiovascular: Normal rate.   Pulmonary/Chest: Effort normal.  Abdominal: Soft. There is no tenderness.  Musculoskeletal: He exhibits no edema.  Neurological: He is alert.  Skin: Skin is warm.     ED Treatments / Results  Labs (all labs ordered are listed, but only abnormal results are displayed) Labs Reviewed  CBC WITH DIFFERENTIAL/PLATELET - Abnormal; Notable for the following:       Result Value   Hemoglobin 17.3 (*)    MCH 34.4 (*)    Monocytes Absolute 1.2 (*)    All other components within normal limits  I-STAT CHEM 8, ED - Abnormal; Notable for the following:    Chloride 98 (*)    BUN 24 (*)    Glucose, Bld 106 (*)    Calcium, Ion 1.03 (*)    Hemoglobin 17.3 (*)    All other components within normal limits    EKG  EKG Interpretation None       Radiology Ct Soft Tissue Neck W Contrast  Result Date: 01/06/2017 CLINICAL DATA:  Difficulty swallowing after cervical spine surgery 3 days ago. History of tonsillectomy. EXAM: CT NECK WITH CONTRAST TECHNIQUE: Multidetector CT imaging of the neck was performed using the standard protocol following the bolus administration of intravenous contrast. CONTRAST:  75mL ISOVUE-300 IOPAMIDOL (ISOVUE-300) INJECTION 61% COMPARISON:  Cervical spine radiograph Jan 03, 2017 FINDINGS: Mild motion degraded examination. PHARYNX AND LARYNX: Fluid collection mildly narrows the pharynx though airway remains patent. Normal larynx. SALIVARY GLANDS: Normal. THYROID: Normal. LYMPH NODES: No lymphadenopathy by CT size criteria. VASCULAR: Patent, trace calcific atherosclerosis LIMITED INTRACRANIAL: Normal. MASTOIDS AND VISUALIZED PARANASAL SINUSES: Well-aerated. SKELETON: Status post C4 through C7 ACDF with intact well-seated hardware. Prevertebral/retropharyngeal effusion 18 mm at C5. Mild fat stranding and gas within LEFT neck along surgical approach  assisted with subcutaneous fat stranding. UPPER CHEST: Lung apices are clear. No superior mediastinal lymphadenopathy. OTHER: None. IMPRESSION: Status post recent C4 through C7 ACDF with prevertebral soft tissue swelling, top normal for postoperative day 4. Narrowed though patent airway. No focal fluid collection. Acute findings discussed with and reconfirmed by Dr.Manmeet Arzola on 01/06/2017 at 11:23 pm. Electronically Signed   By: Awilda Metro M.D.   On: 01/06/2017 23:23    Procedures Procedures (including critical care time)  Medications Ordered in ED Medications  sodium chloride 0.9 % bolus 1,000 mL (1,000 mLs Intravenous New Bag/Given 01/07/17 0005)  dexamethasone (DECADRON) injection 10 mg (not administered)  sodium chloride 0.9 % bolus 1,000 mL (1,000 mLs Intravenous New Bag/Given 01/06/17 2123)  iopamidol (ISOVUE-300) 61 % injection (75 mLs  Contrast Given 01/06/17 2246)  morphine 4 MG/ML injection 4 mg (4 mg Intravenous Given 01/06/17 2155)     Initial Impression / Assessment and Plan /  ED Course  I have reviewed the triage vital signs and the nursing notes.  Pertinent labs & imaging results that were available during my care of the patient were reviewed by me and considered in my medical decision making (see chart for details).     Patient with difficulty swallowing after neck surgery. No airway obstruction on CT. Expected level of swelling. He does however appear dehydrated. He was able to take a pain pill here. IV fluids given. Discussed with Dr. Wynetta Emery recommended Decadron and Dosepak. Discharge home and will follow-up in the office.  Final Clinical Impressions(s) / ED Diagnoses   Final diagnoses:  Postoperative pain    New Prescriptions New Prescriptions   METHYLPREDNISOLONE (MEDROL DOSEPAK) 4 MG TBPK TABLET    Use as directed     Benjiman Core, MD 01/07/17 [phone]

## 2017-01-07 DIAGNOSIS — G8918 Other acute postprocedural pain: Secondary | ICD-10-CM | POA: Diagnosis not present

## 2017-01-07 MED ORDER — METHYLPREDNISOLONE 4 MG PO TBPK: ORAL_TABLET | ORAL | 0 refills | Status: DC

## 2017-01-07 MED ORDER — DEXAMETHASONE SODIUM PHOSPHATE 10 MG/ML IJ SOLN
10.0000 mg | Freq: Once | INTRAMUSCULAR | Status: AC
  Administered 2017-01-07: 10 mg via INTRAVENOUS
  Filled 2017-01-07: qty 1

## 2017-01-07 NOTE — Discharge Instructions (Signed)
Watch for fever or increasing trouble breathing.

## 2017-01-07 NOTE — ED Provider Notes (Signed)
Per dr pickering Pt is to be discharged after IV fluids complete   Zadie RhineWickline, Debria Broecker, MD 01/07/17 [phone]

## 2017-08-02 ENCOUNTER — Other Ambulatory Visit: Payer: Self-pay | Admitting: Orthopedic Surgery

## 2017-09-13 NOTE — Pre-Procedure Instructions (Signed)
Daniel Johns  09/13/2017      [facility] - [place], [place] - [address] AT [facility] [address] [place] [address] Phone: [phone] Fax: [phone]    Your procedure is scheduled on Monday January 21.  Report to [facility] Admitting at 8:00 A.M.  Call this number if you have problems the morning of surgery:  [phone]   Remember:  Do not eat food or drink liquids after midnight.  Take these medicines the morning of surgery with A SIP OF WATER: cetirizine (zyrtec)  7 days prior to surgery STOP taking any Aleve, Naproxen, Ibuprofen, Motrin, Advil, Goody's, BC's, all herbal medications, fish oil, and all vitamins  **FOLLOW your surgeon's instructions on stopping Aspirin. If no instructions were given, please call surgeon's office.**    Do not wear jewelry, make-up or nail polish.  Do not wear lotions, powders, or perfumes, or deodorant.  Do not shave 48 hours prior to surgery.  Men may shave face and neck.  Do not bring valuables to the hospital.  [facility] is not responsible for any belongings or valuables.  Contacts, dentures or bridgework may not be worn into surgery.  Leave your suitcase in the car.  After surgery it may be brought to your room.  For patients admitted to the hospital, discharge time will be determined by your treatment team.  Patients discharged the day of surgery will not be allowed to drive home.  Special instructions:    [facility]- Preparing For Surgery  Before surgery, you can play an important role. Because skin is not sterile, your skin needs to be as free of germs as possible. You can reduce the number of germs on your skin by washing with CHG (chlorahexidine gluconate) Soap before surgery.  CHG is an antiseptic cleaner which kills germs and bonds with the skin to continue killing germs even after washing.  Please do not use if you have an allergy to CHG or antibacterial  soaps. If your skin becomes reddened/irritated stop using the CHG.  Do not shave (including legs and underarms) for at least 48 hours prior to first CHG shower. It is OK to shave your face.  Please follow these instructions carefully.   1. Shower the NIGHT BEFORE SURGERY and the MORNING OF SURGERY with CHG.   2. If you chose to wash your hair, wash your hair first as usual with your normal shampoo.  3. After you shampoo, rinse your hair and body thoroughly to remove the shampoo.  4. Use CHG as you would any other liquid soap. You can apply CHG directly to the skin and wash gently with a scrungie or a clean washcloth.   5. Apply the CHG Soap to your body ONLY FROM THE NECK DOWN.  Do not use on open wounds or open sores. Avoid contact with your eyes, ears, mouth and genitals (private parts). Wash Face and genitals (private parts)  with your normal soap.  6. Wash thoroughly, paying special attention to the area where your surgery will be performed.  7. Thoroughly rinse your body with warm water from the neck down.  8. DO NOT shower/wash with your normal soap after using and rinsing off the CHG Soap.  9. Pat yourself dry with a CLEAN TOWEL.  10. Wear CLEAN PAJAMAS to bed the night before surgery, wear comfortable clothes the morning of surgery  11. Place CLEAN  SHEETS on your bed the night of your first shower and DO NOT SLEEP WITH PETS.    Day of Surgery: Do not apply any deodorants/lotions. Please wear clean clothes to the hospital/surgery center.      Please read over the following fact sheets that you were given. Coughing and Deep Breathing, Total Joint Packet, MRSA Information and Surgical Site Infection Prevention

## 2017-09-16 ENCOUNTER — Encounter (HOSPITAL_COMMUNITY): Payer: Self-pay

## 2017-09-16 ENCOUNTER — Encounter (HOSPITAL_COMMUNITY)
Admission: RE | Admit: 2017-09-16 | Discharge: 2017-09-16 | Disposition: A | Discharge status: Home / Self Care | Payer: Medicare Other | Source: Ambulatory Visit | Attending: Orthopedic Surgery | Admitting: Orthopedic Surgery

## 2017-09-16 DIAGNOSIS — I10 Essential (primary) hypertension: Secondary | ICD-10-CM | POA: Insufficient documentation

## 2017-09-16 DIAGNOSIS — M1712 Unilateral primary osteoarthritis, left knee: Secondary | ICD-10-CM | POA: Insufficient documentation

## 2017-09-16 DIAGNOSIS — G4733 Obstructive sleep apnea (adult) (pediatric): Secondary | ICD-10-CM | POA: Insufficient documentation

## 2017-09-16 DIAGNOSIS — Z01812 Encounter for preprocedural laboratory examination: Secondary | ICD-10-CM | POA: Diagnosis present

## 2017-09-16 HISTORY — DX: Sleep apnea, unspecified: G47.30

## 2017-09-16 LAB — CBC WITH DIFFERENTIAL/PLATELET
BASOS PCT: 1 %
Basophils Absolute: 0 10*3/uL (ref 0.0–0.1)
EOS ABS: 0.1 10*3/uL (ref 0.0–0.7)
EOS PCT: 2 %
HCT: 45.6 % (ref 39.0–52.0)
HEMOGLOBIN: 16.2 g/dL (ref 13.0–17.0)
Lymphocytes Relative: 24 %
Lymphs Abs: 1.4 10*3/uL (ref 0.7–4.0)
MCH: 33.8 pg (ref 26.0–34.0)
MCHC: 35.5 g/dL (ref 30.0–36.0)
MCV: 95.2 fL (ref 78.0–100.0)
MONOS PCT: 12 %
Monocytes Absolute: 0.7 10*3/uL (ref 0.1–1.0)
NEUTROS PCT: 61 %
Neutro Abs: 3.5 10*3/uL (ref 1.7–7.7)
PLATELETS: 158 10*3/uL (ref 150–400)
RBC: 4.79 MIL/uL (ref 4.22–5.81)
RDW: 12.6 % (ref 11.5–15.5)
WBC: 5.7 10*3/uL (ref 4.0–10.5)

## 2017-09-16 LAB — COMPREHENSIVE METABOLIC PANEL
ALBUMIN: 4 g/dL (ref 3.5–5.0)
ALK PHOS: 75 U/L (ref 38–126)
ALT: 32 U/L (ref 17–63)
ANION GAP: 11 (ref 5–15)
AST: 39 U/L (ref 15–41)
BUN: 10 mg/dL (ref 6–20)
CALCIUM: 9.2 mg/dL (ref 8.9–10.3)
CHLORIDE: 99 mmol/L — AB (ref 101–111)
CO2: 25 mmol/L (ref 22–32)
Creatinine, Ser: 1.04 mg/dL (ref 0.61–1.24)
GFR calc non Af Amer: >60 mL/min (ref >60)
Glucose, Bld: 116 mg/dL — ABNORMAL HIGH (ref 65–99)
POTASSIUM: 4.1 mmol/L (ref 3.5–5.1)
SODIUM: 135 mmol/L (ref 135–145)
Total Bilirubin: 2.2 mg/dL — ABNORMAL HIGH (ref 0.3–1.2)
Total Protein: 7.4 g/dL (ref 6.5–8.1)

## 2017-09-16 LAB — SURGICAL PCR SCREEN
MRSA, PCR: NEGATIVE
STAPHYLOCOCCUS AUREUS: NEGATIVE

## 2017-09-17 NOTE — Progress Notes (Signed)
Anesthesia Chart Review:  Pt is a 67 year old male scheduled for L total knee arthroplasty on 09/23/2017 with Dannielle HuhSteve Lucey, MD  - PCP is Lindwood QuaByron Hoffman, MD. Cleared for surgery by Wynn Bankerebra Davis, NP in that office (notes in care everywhere)   PMH includes:  HTN, OSA, post-op N/V. Never smoker. BMI 32. S/p R TKA 11/09/13.    Medications include: ASA 81 mg, lisinopril-HCTZ  BP 128/65   Pulse 61   Temp 37 C   Resp 20   Ht 5\' 11"  (1.803 m)   Wt 229 lb 1.6 oz (103.9 kg)   SpO2 96%   BMI 31.95 kg/m   Preoperative labs reviewed.    EKG 07/31/17 [facility]): Sinus bradycardia (56 bpm).  Echo 10/01/13 (care everywhere):  - LVH. Normal LV EF(55-60%).  Diastolic LV dysfunction.  Elevated LV filling pressures. - Mitralvalve is mildlythickened withnormal leafletmobility. No echocardiographic evidence of pathologic mitral regurgitation. - Dilated LA. - Thoracicaorta is Recruitment consultantmildlydilated measuring2.0 cm atthe LVoutflow tract,3.3 cm at thesinuses of Valsalva,4.1 cmabovethe sinotublar junctionand 2.7 cmat the transversearch - Trivial pulmonary regurgitation.   - Normal RV contractile performance.   - Trivial tricuspid regurgitation  Reviewed case with Dr. Arby BarretteHatchett.  If no changes, I anticipate pt can proceed with surgery as scheduled.   Rica Mastngela Lorane Cousar, FNP-BC [facility] Phone: [phone] 09/17/2017 4:14 PM

## 2017-09-20 MED ORDER — TRANEXAMIC ACID 1000 MG/10ML IV SOLN
1000.0000 mg | INTRAVENOUS | Status: AC
  Administered 2017-09-23: 1000 mg via INTRAVENOUS
  Filled 2017-09-20: qty 1100

## 2017-09-20 MED ORDER — DEXTROSE 5 % IV SOLN
3.0000 g | INTRAVENOUS | Status: AC
  Administered 2017-09-23: 3 g via INTRAVENOUS
  Filled 2017-09-20: qty 3

## 2017-09-20 MED ORDER — BUPIVACAINE LIPOSOME 1.3 % IJ SUSP
20.0000 mL | INTRAMUSCULAR | Status: DC
  Filled 2017-09-20: qty 20

## 2017-09-23 ENCOUNTER — Encounter (HOSPITAL_COMMUNITY)
Admission: RE | Disposition: A | Discharge status: Home / Self Care | Payer: Self-pay | Source: Ambulatory Visit | Attending: Orthopedic Surgery

## 2017-09-23 ENCOUNTER — Ambulatory Visit (HOSPITAL_COMMUNITY): Payer: Medicare Other | Admitting: Emergency Medicine

## 2017-09-23 ENCOUNTER — Other Ambulatory Visit: Payer: Self-pay

## 2017-09-23 ENCOUNTER — Ambulatory Visit (HOSPITAL_COMMUNITY): Payer: Medicare Other | Admitting: Certified Registered"

## 2017-09-23 ENCOUNTER — Observation Stay (HOSPITAL_COMMUNITY)
Admission: RE | Admit: 2017-09-23 | Discharge: 2017-09-24 | Disposition: A | Discharge status: Home / Self Care | Payer: Medicare Other | Source: Ambulatory Visit | Attending: Orthopedic Surgery | Admitting: Orthopedic Surgery

## 2017-09-23 ENCOUNTER — Encounter (HOSPITAL_COMMUNITY): Payer: Self-pay

## 2017-09-23 DIAGNOSIS — Z79899 Other long term (current) drug therapy: Secondary | ICD-10-CM | POA: Diagnosis not present

## 2017-09-23 DIAGNOSIS — Z7982 Long term (current) use of aspirin: Secondary | ICD-10-CM | POA: Insufficient documentation

## 2017-09-23 DIAGNOSIS — Z96659 Presence of unspecified artificial knee joint: Secondary | ICD-10-CM

## 2017-09-23 DIAGNOSIS — M109 Gout, unspecified: Secondary | ICD-10-CM | POA: Diagnosis not present

## 2017-09-23 DIAGNOSIS — G4730 Sleep apnea, unspecified: Secondary | ICD-10-CM | POA: Diagnosis not present

## 2017-09-23 DIAGNOSIS — I10 Essential (primary) hypertension: Secondary | ICD-10-CM | POA: Diagnosis not present

## 2017-09-23 DIAGNOSIS — M1712 Unilateral primary osteoarthritis, left knee: Principal | ICD-10-CM | POA: Insufficient documentation

## 2017-09-23 HISTORY — PX: TOTAL KNEE ARTHROPLASTY: SHX125

## 2017-09-23 HISTORY — DX: Unilateral primary osteoarthritis, left knee: M17.12

## 2017-09-23 SURGERY — ARTHROPLASTY, KNEE, TOTAL
Anesthesia: Spinal | Site: Knee | Laterality: Left

## 2017-09-23 MED ORDER — LISINOPRIL 20 MG PO TABS
20.0000 mg | ORAL_TABLET | Freq: Every day | ORAL | Status: DC
  Administered 2017-09-23 – 2017-09-24 (×2): 20 mg via ORAL
  Filled 2017-09-23 (×2): qty 1

## 2017-09-23 MED ORDER — ONDANSETRON HCL 4 MG/2ML IJ SOLN: 4.0000 mg | Freq: Once | INTRAMUSCULAR | Status: DC | PRN

## 2017-09-23 MED ORDER — DOCUSATE SODIUM 100 MG PO CAPS
100.0000 mg | ORAL_CAPSULE | Freq: Two times a day (BID) | ORAL | Status: DC
  Administered 2017-09-24: 100 mg via ORAL
  Filled 2017-09-23 (×2): qty 1

## 2017-09-23 MED ORDER — TRANEXAMIC ACID 1000 MG/10ML IV SOLN
1000.0000 mg | Freq: Once | INTRAVENOUS | Status: AC
  Administered 2017-09-23: 1000 mg via INTRAVENOUS
  Filled 2017-09-23: qty 10

## 2017-09-23 MED ORDER — PROPOFOL 1000 MG/100ML IV EMUL
INTRAVENOUS | Status: AC
  Filled 2017-09-23: qty 200

## 2017-09-23 MED ORDER — MIDAZOLAM HCL 2 MG/2ML IJ SOLN
INTRAMUSCULAR | Status: AC
  Administered 2017-09-23: 2 mg via INTRAVENOUS
  Filled 2017-09-23: qty 2

## 2017-09-23 MED ORDER — MENTHOL 3 MG MT LOZG: 1.0000 | LOZENGE | OROMUCOSAL | Status: DC | PRN

## 2017-09-23 MED ORDER — FENTANYL CITRATE (PF) 100 MCG/2ML IJ SOLN
50.0000 ug | Freq: Once | INTRAMUSCULAR | Status: AC
  Administered 2017-09-23: 50 ug via INTRAVENOUS

## 2017-09-23 MED ORDER — FLEET ENEMA 7-19 GM/118ML RE ENEM: 1.0000 | ENEMA | Freq: Once | RECTAL | Status: DC | PRN

## 2017-09-23 MED ORDER — ACETAMINOPHEN 650 MG RE SUPP: 650.0000 mg | RECTAL | Status: DC | PRN

## 2017-09-23 MED ORDER — HYDROCHLOROTHIAZIDE 12.5 MG PO CAPS
12.5000 mg | ORAL_CAPSULE | Freq: Every day | ORAL | Status: DC
  Administered 2017-09-23 – 2017-09-24 (×2): 12.5 mg via ORAL
  Filled 2017-09-23 (×2): qty 1

## 2017-09-23 MED ORDER — GABAPENTIN 300 MG PO CAPS
300.0000 mg | ORAL_CAPSULE | Freq: Once | ORAL | Status: AC
  Administered 2017-09-23: 300 mg via ORAL
  Filled 2017-09-23: qty 1

## 2017-09-23 MED ORDER — ROPIVACAINE HCL 7.5 MG/ML IJ SOLN
INTRAMUSCULAR | Status: DC | PRN
  Administered 2017-09-23: 20 mL via PERINEURAL

## 2017-09-23 MED ORDER — BUPIVACAINE LIPOSOME 1.3 % IJ SUSP
INTRAMUSCULAR | Status: DC | PRN
  Administered 2017-09-23: 20 mL

## 2017-09-23 MED ORDER — SENNOSIDES-DOCUSATE SODIUM 8.6-50 MG PO TABS: 1.0000 | ORAL_TABLET | Freq: Every evening | ORAL | Status: DC | PRN

## 2017-09-23 MED ORDER — PHENOL 1.4 % MT LIQD: 1.0000 | OROMUCOSAL | Status: DC | PRN

## 2017-09-23 MED ORDER — ALUM & MAG HYDROXIDE-SIMETH 200-200-20 MG/5ML PO SUSP: 30.0000 mL | ORAL | Status: DC | PRN

## 2017-09-23 MED ORDER — FENTANYL CITRATE (PF) 250 MCG/5ML IJ SOLN
INTRAMUSCULAR | Status: DC | PRN
  Administered 2017-09-23: 50 ug via INTRAVENOUS
  Administered 2017-09-23: 25 ug via INTRAVENOUS

## 2017-09-23 MED ORDER — ONDANSETRON HCL 4 MG/2ML IJ SOLN
INTRAMUSCULAR | Status: AC
  Filled 2017-09-23: qty 2

## 2017-09-23 MED ORDER — OXYCODONE HCL 5 MG PO TABS
5.0000 mg | ORAL_TABLET | ORAL | Status: DC | PRN
  Administered 2017-09-23 – 2017-09-24 (×5): 5 mg via ORAL
  Filled 2017-09-23 (×5): qty 1

## 2017-09-23 MED ORDER — FENTANYL CITRATE (PF) 100 MCG/2ML IJ SOLN
INTRAMUSCULAR | Status: AC
  Administered 2017-09-23: 50 ug via INTRAVENOUS
  Filled 2017-09-23: qty 2

## 2017-09-23 MED ORDER — CHLORHEXIDINE GLUCONATE 4 % EX LIQD: 60.0000 mL | Freq: Once | CUTANEOUS | Status: DC

## 2017-09-23 MED ORDER — ONDANSETRON HCL 4 MG/2ML IJ SOLN: 4.0000 mg | Freq: Four times a day (QID) | INTRAMUSCULAR | Status: DC | PRN

## 2017-09-23 MED ORDER — METHOCARBAMOL 1000 MG/10ML IJ SOLN
500.0000 mg | Freq: Four times a day (QID) | INTRAMUSCULAR | Status: DC | PRN
  Filled 2017-09-23: qty 5

## 2017-09-23 MED ORDER — CEFAZOLIN SODIUM-DEXTROSE 1-4 GM/50ML-% IV SOLN
1.0000 g | Freq: Four times a day (QID) | INTRAVENOUS | Status: AC
  Administered 2017-09-23 – 2017-09-24 (×2): 1 g via INTRAVENOUS
  Filled 2017-09-23 (×2): qty 50

## 2017-09-23 MED ORDER — ONDANSETRON HCL 4 MG/2ML IJ SOLN
INTRAMUSCULAR | Status: DC | PRN
  Administered 2017-09-23: 4 mg via INTRAVENOUS

## 2017-09-23 MED ORDER — BUPIVACAINE-EPINEPHRINE (PF) 0.5% -1:200000 IJ SOLN
INTRAMUSCULAR | Status: AC
  Filled 2017-09-23: qty 30

## 2017-09-23 MED ORDER — LACTATED RINGERS IV SOLN
INTRAVENOUS | Status: DC
  Administered 2017-09-23: 09:00:00 via INTRAVENOUS

## 2017-09-23 MED ORDER — PROPOFOL 10 MG/ML IV BOLUS
INTRAVENOUS | Status: DC | PRN
  Administered 2017-09-23: 20 mg via INTRAVENOUS

## 2017-09-23 MED ORDER — SODIUM CHLORIDE 0.9 % IR SOLN
Status: DC | PRN
  Administered 2017-09-23: 1

## 2017-09-23 MED ORDER — ZOLPIDEM TARTRATE 5 MG PO TABS: 5.0000 mg | ORAL_TABLET | Freq: Every evening | ORAL | Status: DC | PRN

## 2017-09-23 MED ORDER — HYDROCODONE-ACETAMINOPHEN 7.5-325 MG PO TABS
1.0000 | ORAL_TABLET | Freq: Four times a day (QID) | ORAL | Status: DC
  Administered 2017-09-23 – 2017-09-24 (×4): 1 via ORAL
  Filled 2017-09-23 (×4): qty 1

## 2017-09-23 MED ORDER — GABAPENTIN 300 MG PO CAPS
300.0000 mg | ORAL_CAPSULE | Freq: Three times a day (TID) | ORAL | Status: DC
  Administered 2017-09-23 – 2017-09-24 (×3): 300 mg via ORAL
  Filled 2017-09-23 (×3): qty 1

## 2017-09-23 MED ORDER — PROPOFOL 500 MG/50ML IV EMUL
INTRAVENOUS | Status: DC | PRN
  Administered 2017-09-23: 75 ug/kg/min via INTRAVENOUS

## 2017-09-23 MED ORDER — DEXAMETHASONE SODIUM PHOSPHATE 10 MG/ML IJ SOLN
8.0000 mg | Freq: Once | INTRAMUSCULAR | Status: AC
  Administered 2017-09-23: 8 mg via INTRAVENOUS
  Filled 2017-09-23: qty 1

## 2017-09-23 MED ORDER — ASPIRIN EC 325 MG PO TBEC
325.0000 mg | DELAYED_RELEASE_TABLET | Freq: Two times a day (BID) | ORAL | Status: DC
  Administered 2017-09-23 – 2017-09-24 (×2): 325 mg via ORAL
  Filled 2017-09-23 (×2): qty 1

## 2017-09-23 MED ORDER — BISACODYL 5 MG PO TBEC
5.0000 mg | DELAYED_RELEASE_TABLET | Freq: Every day | ORAL | Status: DC | PRN
Start: 2017-09-23 — End: 2017-09-24

## 2017-09-23 MED ORDER — METOCLOPRAMIDE HCL 5 MG PO TABS: 5.0000 mg | ORAL_TABLET | Freq: Three times a day (TID) | ORAL | Status: DC | PRN

## 2017-09-23 MED ORDER — PHENYLEPHRINE 40 MCG/ML (10ML) SYRINGE FOR IV PUSH (FOR BLOOD PRESSURE SUPPORT)
PREFILLED_SYRINGE | INTRAVENOUS | Status: AC
  Filled 2017-09-23: qty 10

## 2017-09-23 MED ORDER — ACETAMINOPHEN 325 MG PO TABS: 650.0000 mg | ORAL_TABLET | ORAL | Status: DC | PRN

## 2017-09-23 MED ORDER — HYDROMORPHONE HCL 1 MG/ML IJ SOLN: 0.2500 mg | INTRAMUSCULAR | Status: DC | PRN

## 2017-09-23 MED ORDER — METHOCARBAMOL 500 MG PO TABS
500.0000 mg | ORAL_TABLET | Freq: Four times a day (QID) | ORAL | Status: DC | PRN
  Administered 2017-09-23 – 2017-09-24 (×2): 500 mg via ORAL
  Filled 2017-09-23 (×2): qty 1

## 2017-09-23 MED ORDER — BUPIVACAINE-EPINEPHRINE 0.5% -1:200000 IJ SOLN
INTRAMUSCULAR | Status: DC | PRN
  Administered 2017-09-23: 30 mL

## 2017-09-23 MED ORDER — MIDAZOLAM HCL 2 MG/2ML IJ SOLN
2.0000 mg | Freq: Once | INTRAMUSCULAR | Status: AC
  Administered 2017-09-23: 2 mg via INTRAVENOUS

## 2017-09-23 MED ORDER — FENTANYL CITRATE (PF) 250 MCG/5ML IJ SOLN
INTRAMUSCULAR | Status: AC
  Filled 2017-09-23: qty 5

## 2017-09-23 MED ORDER — ONDANSETRON HCL 4 MG PO TABS: 4.0000 mg | ORAL_TABLET | Freq: Four times a day (QID) | ORAL | Status: DC | PRN

## 2017-09-23 MED ORDER — CELECOXIB 200 MG PO CAPS
200.0000 mg | ORAL_CAPSULE | Freq: Two times a day (BID) | ORAL | Status: DC
  Administered 2017-09-23 – 2017-09-24 (×2): 200 mg via ORAL
  Filled 2017-09-23 (×2): qty 1

## 2017-09-23 MED ORDER — MEPERIDINE HCL 25 MG/ML IJ SOLN: 6.2500 mg | INTRAMUSCULAR | Status: DC | PRN

## 2017-09-23 MED ORDER — DEXAMETHASONE SODIUM PHOSPHATE 10 MG/ML IJ SOLN
10.0000 mg | Freq: Once | INTRAMUSCULAR | Status: AC
  Administered 2017-09-24: 10 mg via INTRAVENOUS
  Filled 2017-09-23: qty 1

## 2017-09-23 MED ORDER — EPHEDRINE 5 MG/ML INJ
INTRAVENOUS | Status: AC
  Filled 2017-09-23: qty 10

## 2017-09-23 MED ORDER — DIPHENHYDRAMINE HCL 12.5 MG/5ML PO ELIX: 12.5000 mg | ORAL_SOLUTION | ORAL | Status: DC | PRN

## 2017-09-23 MED ORDER — ACETAMINOPHEN 500 MG PO TABS
1000.0000 mg | ORAL_TABLET | Freq: Once | ORAL | Status: AC
  Administered 2017-09-23: 1000 mg via ORAL
  Filled 2017-09-23: qty 2

## 2017-09-23 MED ORDER — SODIUM CHLORIDE 0.9 % IJ SOLN
INTRAMUSCULAR | Status: DC | PRN
  Administered 2017-09-23: 20 mL

## 2017-09-23 MED ORDER — LISINOPRIL-HYDROCHLOROTHIAZIDE 20-12.5 MG PO TABS: 1.0000 | ORAL_TABLET | Freq: Every day | ORAL | Status: DC

## 2017-09-23 MED ORDER — HYDROMORPHONE HCL 1 MG/ML IJ SOLN: 1.0000 mg | INTRAMUSCULAR | Status: DC | PRN

## 2017-09-23 MED ORDER — METOCLOPRAMIDE HCL 5 MG/ML IJ SOLN: 5.0000 mg | Freq: Three times a day (TID) | INTRAMUSCULAR | Status: DC | PRN

## 2017-09-23 SURGICAL SUPPLY — 61 items
BANDAGE ESMARK 6X9 LF (GAUZE/BANDAGES/DRESSINGS) ×1 IMPLANT
BLADE SAGITTAL 13X1.27X60 (BLADE) ×2 IMPLANT
BLADE SAGITTAL 13X1.27X60MM (BLADE) ×1
BLADE SAW SGTL 83.5X18.5 (BLADE) ×3 IMPLANT
BLADE SURG 10 STRL SS (BLADE) ×3 IMPLANT
BNDG CMPR 9X6 STRL LF SNTH (GAUZE/BANDAGES/DRESSINGS) ×1
BNDG ESMARK 6X9 LF (GAUZE/BANDAGES/DRESSINGS) ×3
BOWL SMART MIX CTS (DISPOSABLE) ×3 IMPLANT
CAPT KNEE TOTAL 3 ×3 IMPLANT
CEMENT BONE SIMPLEX SPEEDSET (Cement) ×6 IMPLANT
CLOSURE WOUND 1/2 X4 (GAUZE/BANDAGES/DRESSINGS) ×1
COVER SURGICAL LIGHT HANDLE (MISCELLANEOUS) ×3 IMPLANT
CUFF TOURNIQUET SINGLE 34IN LL (TOURNIQUET CUFF) ×3 IMPLANT
DRAPE EXTREMITY T 121X128X90 (DRAPE) ×3 IMPLANT
DRAPE HALF SHEET 40X57 (DRAPES) ×3 IMPLANT
DRAPE INCISE IOBAN 66X45 STRL (DRAPES) ×6 IMPLANT
DRAPE U-SHAPE 47X51 STRL (DRAPES) ×3 IMPLANT
DRSG AQUACEL AG ADV 3.5X14 (GAUZE/BANDAGES/DRESSINGS) ×2 IMPLANT
DURAPREP 26ML APPLICATOR (WOUND CARE) ×6 IMPLANT
ELECT REM PT RETURN 9FT ADLT (ELECTROSURGICAL) ×3
ELECTRODE REM PT RTRN 9FT ADLT (ELECTROSURGICAL) ×1 IMPLANT
GLOVE BIOGEL M 7.0 STRL (GLOVE) IMPLANT
GLOVE BIOGEL PI IND STRL 7.5 (GLOVE) IMPLANT
GLOVE BIOGEL PI IND STRL 8.5 (GLOVE) ×1 IMPLANT
GLOVE BIOGEL PI INDICATOR 7.5 (GLOVE)
GLOVE BIOGEL PI INDICATOR 8.5 (GLOVE) ×2
GLOVE SURG ORTHO 8.0 STRL STRW (GLOVE) ×6 IMPLANT
GOWN STRL REUS W/ TWL LRG LVL3 (GOWN DISPOSABLE) ×1 IMPLANT
GOWN STRL REUS W/ TWL XL LVL3 (GOWN DISPOSABLE) ×2 IMPLANT
GOWN STRL REUS W/TWL 2XL LVL3 (GOWN DISPOSABLE) ×3 IMPLANT
GOWN STRL REUS W/TWL LRG LVL3 (GOWN DISPOSABLE) ×3
GOWN STRL REUS W/TWL XL LVL3 (GOWN DISPOSABLE) ×6
HANDPIECE INTERPULSE COAX TIP (DISPOSABLE) ×3
HOOD PEEL AWAY FACE SHEILD DIS (HOOD) ×9 IMPLANT
KIT BASIN OR (CUSTOM PROCEDURE TRAY) ×3 IMPLANT
KIT ROOM TURNOVER OR (KITS) ×3 IMPLANT
KNEE CAPITATED TOTAL 3 IMPLANT
MANIFOLD NEPTUNE II (INSTRUMENTS) ×3 IMPLANT
NDL 18GX1X1/2 (RX/OR ONLY) (NEEDLE) IMPLANT
NEEDLE 18GX1X1/2 (RX/OR ONLY) (NEEDLE) IMPLANT
NEEDLE 22X1 1/2 (OR ONLY) (NEEDLE) ×6 IMPLANT
NS IRRIG 1000ML POUR BTL (IV SOLUTION) ×3 IMPLANT
PACK TOTAL JOINT (CUSTOM PROCEDURE TRAY) ×3 IMPLANT
PAD ARMBOARD 7.5X6 YLW CONV (MISCELLANEOUS) ×6 IMPLANT
SET HNDPC FAN SPRY TIP SCT (DISPOSABLE) ×1 IMPLANT
STRIP CLOSURE SKIN 1/2X4 (GAUZE/BANDAGES/DRESSINGS) ×2 IMPLANT
SUCTION FRAZIER HANDLE 10FR (MISCELLANEOUS)
SUCTION TUBE FRAZIER 10FR DISP (MISCELLANEOUS) IMPLANT
SUT BONE WAX W31G (SUTURE) ×3 IMPLANT
SUT MNCRL AB 3-0 PS2 18 (SUTURE) ×3 IMPLANT
SUT VIC AB 0 CTB1 27 (SUTURE) ×3 IMPLANT
SUT VIC AB 1 CT1 27 (SUTURE) ×6
SUT VIC AB 1 CT1 27XBRD ANBCTR (SUTURE) ×2 IMPLANT
SUT VIC AB 2-0 CT1 27 (SUTURE) ×6
SUT VIC AB 2-0 CT1 TAPERPNT 27 (SUTURE) ×2 IMPLANT
SUT VLOC 180 0 24IN GS25 (SUTURE) ×3 IMPLANT
SYR 20CC LL (SYRINGE) ×6 IMPLANT
TOWEL OR 17X24 6PK STRL BLUE (TOWEL DISPOSABLE) ×3 IMPLANT
TOWEL OR 17X26 10 PK STRL BLUE (TOWEL DISPOSABLE) ×3 IMPLANT
TRAY CATH 16FR W/PLASTIC CATH (SET/KITS/TRAYS/PACK) IMPLANT
WRAP KNEE MAXI GEL POST OP (GAUZE/BANDAGES/DRESSINGS) ×3 IMPLANT

## 2017-09-23 NOTE — Transfer of Care (Signed)
Immediate Anesthesia Transfer of Care Note  Patient: Daniel Johns  Procedure(s) Performed: TOTAL KNEE ARTHROPLASTY (Left Knee)  Patient Location: PACU  Anesthesia Type:MAC and Spinal  Level of Consciousness: awake, alert , oriented and patient cooperative  Airway & Oxygen Therapy: Patient Spontanous Breathing and Patient connected to nasal cannula oxygen  Post-op Assessment: Report given to RN and Post -op Vital signs reviewed and stable  Post vital signs: Reviewed and stable  Last Vitals:  Vitals:   09/23/17 1010 09/23/17 1321  BP: (!) 138/91 (!) 146/97  Pulse: 95 85  Resp: 19 13  Temp:    SpO2: 98% 98%    Last Pain:  Vitals:   09/23/17 0843  TempSrc:   PainSc: 0-No pain      Patients Stated Pain Goal: 3 (62/37/62 8315)  Complications: No apparent anesthesia complications

## 2017-09-23 NOTE — Progress Notes (Signed)
Orthopedic Tech Progress Note Patient Details:  Daniel RocherJerry W Johns 11/08/1950 [id number]  CPM Left Knee CPM Left Knee: On Left Knee Flexion (Degrees): 90 Left Knee Extension (Degrees): 0 Additional Comments: foot roll  Post Interventions Patient Tolerated: Well Instructions Provided: Care of device, Adjustment of device  Saul FordyceJennifer C [name] 09/23/2017, 1:43 PM

## 2017-09-23 NOTE — Anesthesia Preprocedure Evaluation (Signed)
Anesthesia Evaluation  Patient identified by MRN, date of birth, ID band Patient awake    Reviewed: Allergy & Precautions, NPO status , Patient's Chart, lab work & pertinent test results  History of Anesthesia Complications (+) PONV  Airway Mallampati: I  TM Distance: >3 FB Neck ROM: Full    Dental   Pulmonary sleep apnea ,    Pulmonary exam normal        Cardiovascular hypertension, Pt. on medications Normal cardiovascular exam     Neuro/Psych    GI/Hepatic   Endo/Other    Renal/GU      Musculoskeletal   Abdominal   Peds  Hematology   Anesthesia Other Findings   Reproductive/Obstetrics                             Anesthesia Physical Anesthesia Plan  ASA: II  Anesthesia Plan: Spinal   Post-op Pain Management:  Regional for Post-op pain   Induction: Intravenous  PONV Risk Score and Plan: 2 and Ondansetron, Treatment may vary due to age or medical condition and Midazolam  Airway Management Planned: Simple Face Mask  Additional Equipment:   Intra-op Plan:   Post-operative Plan:   Informed Consent: I have reviewed the patients History and Physical, chart, labs and discussed the procedure including the risks, benefits and alternatives for the proposed anesthesia with the patient or authorized representative who has indicated his/her understanding and acceptance.     Plan Discussed with: CRNA and Surgeon  Anesthesia Plan Comments:         Anesthesia Quick Evaluation

## 2017-09-23 NOTE — Evaluation (Signed)
Physical Therapy Evaluation Patient Details Name: CALDWELL KRONENBERGER MRN: [id number] DOB: 11-Dec-1950 Today's Date: 09/23/2017   History of Present Illness  Pt s/p lt TKA. PMH - rt TKA, HTN, back surgery, gout  Clinical Impression  Pt presents to PT with decr mobility s/p Lt TKA. Pt seen same day as surgery and still experiencing numbness in lower extremities s/p spinal. Able to stand at bedside and take small side steps at the side of the bed. Expect when numbness wears off pt will mobilize quite well.     Follow Up Recommendations DC plan and follow up therapy as arranged by surgeon    Equipment Recommendations  None recommended by PT    Recommendations for Other Services       Precautions / Restrictions Precautions Precautions: Knee Restrictions Weight Bearing Restrictions: Yes LLE Weight Bearing: Weight bearing as tolerated      Mobility  Bed Mobility Overal bed mobility: Modified Independent                Transfers Overall transfer level: Needs assistance Equipment used: Rolling walker (2 wheeled) Transfers: Sit to/from Stand Sit to Stand: Mod assist;From elevated surface         General transfer comment: Assist to bring hips up.   Ambulation/Gait Ambulation/Gait assistance: Min assist Ambulation Distance (Feet): 2 Feet(sidestepping by side of bed) Assistive device: Rolling walker (2 wheeled) Gait Pattern/deviations: Step-to pattern;Decreased step length - right;Decreased step length - left;Shuffle Gait velocity: decr Gait velocity interpretation: Below normal speed for age/gender General Gait Details: Assist for balance and safety. Pt with very limited sensation in LE's after spinal making ambulation difficult   Stairs            Wheelchair Mobility    Modified Rankin (Stroke Patients Only)       Balance Overall balance assessment: No apparent balance deficits (not formally assessed)                                            Pertinent Vitals/Pain Pain Assessment: 0-10 Pain Score: 5  Pain Location: lt knee Pain Descriptors / Indicators: Aching Pain Intervention(s): Limited activity within patient's tolerance;Monitored during session;Premedicated before session;Ice applied    Home Living Family/patient expects to be discharged to:: Private residence Living Arrangements: Spouse/significant other Available Help at Discharge: Family;Available 24 hours/day Type of Home: House Home Access: Stairs to enter Entrance Stairs-Rails: Doctor, general practice of Steps: 3 Home Layout: Two level Home Equipment: Walker - 2 wheels;Bedside commode;Shower seat - built in      Prior Function Level of Independence: Independent               Hand Dominance   Dominant Hand: Right    Extremity/Trunk Assessment   Upper Extremity Assessment Upper Extremity Assessment: Overall WFL for tasks assessed    Lower Extremity Assessment Lower Extremity Assessment: RLE deficits/detail;LLE deficits/detail RLE Sensation: decreased proprioception;decreased light touch(due to spinal anesthesia) LLE Deficits / Details: Good quad set and able to perform straight leg raise with 10 degree lag LLE Sensation: decreased light touch;decreased proprioception(due to spinal anesthesia)       Communication   Communication: No difficulties  Cognition Arousal/Alertness: Awake/alert Behavior During Therapy: WFL for tasks assessed/performed Overall Cognitive Status: Within Functional Limits for tasks assessed  General Comments      Exercises Total Joint Exercises Quad Sets: Strengthening;Left;5 reps;Supine Straight Leg Raises: Strengthening;Left;5 reps;Supine Goniometric ROM: 5-92 degrees   Assessment/Plan    PT Assessment Patient needs continued PT services  PT Problem List Decreased strength;Decreased mobility;Impaired sensation       PT Treatment  Interventions DME instruction;Gait training;Stair training;Functional mobility training;Therapeutic activities;Therapeutic exercise;Patient/family education    PT Goals (Current goals can be found in the Care Plan section)  Acute Rehab PT Goals Patient Stated Goal: return home PT Goal Formulation: With patient Time For Goal Achievement: 09/25/17 Potential to Achieve Goals: Good    Frequency 7X/week   Barriers to discharge        Co-evaluation               AM-PAC PT "6 Clicks" Daily Activity  Outcome Measure Difficulty turning over in bed (including adjusting bedclothes, sheets and blankets)?: A Little Difficulty moving from lying on back to sitting on the side of the bed? : A Little Difficulty sitting down on and standing up from a chair with arms (e.g., wheelchair, bedside commode, etc,.)?: Unable Help needed moving to and from a bed to chair (including a wheelchair)?: A Lot Help needed walking in hospital room?: Total Help needed climbing 3-5 steps with a railing? : Total 6 Click Score: 11    End of Session Equipment Utilized During Treatment: Gait belt Activity Tolerance: Patient tolerated treatment well;Other (comment)(limited by numbness of LE's) Patient left: in bed;with call bell/phone within reach;with family/visitor present Nurse Communication: Mobility status;Other (comment)(numbness and that pt's belongings bag not up from OR) PT Visit Diagnosis: Other abnormalities of gait and mobility (R26.89)    Time: [id number] PT Time Calculation (min) (ACUTE ONLY): 32 min   Charges:   PT Evaluation $PT Eval Moderate Complexity: 1 Mod PT Treatments $Therapeutic Activity: 8-22 mins   PT G Codes:   PT G-Codes **NOT FOR INPATIENT CLASS** Functional Assessment Tool Used: AM-PAC 6 Clicks Basic Mobility Functional Limitation: Mobility: Walking and moving around Mobility: Walking and Moving Around Current Status ([id number](G8978): At least 60 percent but less than 80 percent  impaired, limited or restricted Mobility: Walking and Moving Around Goal Status [id number](G8979): At least 1 percent but less than 20 percent impaired, limited or restricted    [facility] HospitalCary Analisse Randle PT [phone]   Angelina OkCary W [facility]aycok 09/23/2017, 4:40 PM

## 2017-09-23 NOTE — Anesthesia Procedure Notes (Signed)
Anesthesia Regional Block: Adductor canal block   Pre-Anesthetic Checklist: ,, timeout performed, Correct Patient, Correct Site, Correct Laterality, Correct Procedure, Correct Position, site marked, Risks and benefits discussed,  Surgical consent,  Pre-op evaluation,  At surgeon's request and post-op pain management  Laterality: Left  Prep: chloraprep       Needles:  Injection technique: Single-shot  Needle Type: Echogenic Stimulator Needle     Needle Length: 9cm  Needle Gauge: 21     Additional Needles:   Narrative:  Start time: 09/23/2017 9:50 AM End time: 09/23/2017 10:00 AM Injection made incrementally with aspirations every 5 mL.  Performed by: Personally  Anesthesiologist: Arta Brucessey, Annet Manukyan, MD  Additional Notes: Monitors applied. Patient sedated. Sterile prep and drape,hand hygiene and sterile gloves were used. Relevant anatomy identified.Needle position confirmed.Local anesthetic injected incrementally after negative aspiration. Local anesthetic spread visualized around nerve(s). Vascular puncture avoided. No complications. Image printed for medical record.The patient tolerated the procedure well.    Arta BruceKevin Amore Ackman MD

## 2017-09-23 NOTE — Anesthesia Postprocedure Evaluation (Signed)
Anesthesia Post Note  Patient: Daniel Johns  Procedure(s) Performed: TOTAL KNEE ARTHROPLASTY (Left Knee)     Patient location during evaluation: PACU Anesthesia Type: Spinal Level of consciousness: oriented and awake and alert Pain management: pain level controlled Vital Signs Assessment: post-procedure vital signs reviewed and stable Respiratory status: spontaneous breathing, respiratory function stable and patient connected to nasal cannula oxygen Cardiovascular status: blood pressure returned to baseline and stable Postop Assessment: no headache, no backache and no apparent nausea or vomiting Anesthetic complications: no    Last Vitals:  Vitals:   09/23/17 1406 09/23/17 1414  BP: (!) 147/100 133/80  Pulse: 83 91  Resp: 10 15  Temp:    SpO2: 95% 94%    Last Pain:  Vitals:   09/23/17 0843  TempSrc:   PainSc: 0-No pain                 Arabelle Bollig DAVID

## 2017-09-23 NOTE — H&P (Signed)
Daniel Johns MRN:  [id number] DOB/SEX:  10/17/1950/male  CHIEF COMPLAINT:  Painful left Knee  HISTORY: Patient is a 67 y.o. male presented with a history of pain in the left knee. Onset of symptoms was gradual starting a few years ago with gradually worsening course since that time. Patient has been treated conservatively with over-the-counter NSAIDs and activity modification. Patient currently rates pain in the knee at 10 out of 10 with activity. There is pain at night.  PAST MEDICAL HISTORY: Patient Active Problem List   Diagnosis Date Noted  . 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 Arthritis   . History of gout   . Hypertension    takes Lisinopril and HCTZ daily  . Joint pain    takes Hydrocodone as needed and Ibuprofen as well if needed  . Joint swelling   . Osteoarthritis of left knee    Severe  . PONV (postoperative nausea and vomiting)    slow to wake up  . Sciatica    occaionally  . Seasonal allergies    takes Zyrtec daily and Afrin nasal spray daily as needed for congestion  . Sleep apnea    cpap   Past Surgical History:  Procedure Laterality Date  . BACK SURGERY    . bone spurs removed from left shoulder    . KNEE ARTHROSCOPY     x 2  . left knee arthroscopy     x 1  . sty removed from left eye    . TONSILLECTOMY    . TOTAL KNEE ARTHROPLASTY Right 11/09/2013   Procedure: TOTAL KNEE ARTHROPLASTY;  Surgeon: Dannielle Huh, MD;  Location: [facility] OR;  Service: Orthopedics;  Laterality: Right;     MEDICATIONS:   Medications Prior to Admission  Medication Sig Dispense Refill Last Dose  . cetirizine (ZYRTEC) 10 MG tablet Take 10 mg by mouth daily.   09/23/2017 at 0700  . lisinopril-hydrochlorothiazide (PRINZIDE,ZESTORETIC) 20-12.5 MG tablet Take 1 tablet by mouth daily.  5 09/22/2017 at Unknown time  . aspirin EC 81 MG tablet Take 81 mg by mouth daily.       ALLERGIES:  No Known Allergies  REVIEW OF SYSTEMS:  A comprehensive  review of systems was negative except for: Musculoskeletal: positive for arthralgias and bone pain   FAMILY HISTORY:  History reviewed. No pertinent family history.  SOCIAL HISTORY:   Social History   Tobacco Use  . Smoking status: Never Smoker  . Smokeless tobacco: Never Used  Substance Use Topics  . Alcohol use: Yes    Comment: daily beer     EXAMINATION:  Vital signs in last 24 hours: Temp:  [98.4 F (36.9 C)] 98.4 F (36.9 C) (01/21 0835) Pulse Rate:  [81] 81 (01/21 0835) Resp:  [20] 20 (01/21 0835) BP: (149)/(90) 149/90 (01/21 0835) Weight:  [103.9 kg (229 lb)] 103.9 kg (229 lb) (01/21 0843)  BP (!) 149/90   Pulse 81   Temp 98.4 F (36.9 C) (Oral)   Resp 20   Ht 5\' 11"  (1.803 m)   Wt 103.9 kg (229 lb)   BMI 31.94 kg/m   General Appearance:    Alert, cooperative, no distress, appears stated age  Head:    Normocephalic, without obvious abnormality, atraumatic  Eyes:    PERRL, conjunctiva/corneas clear, EOM's intact, fundi    benign, both eyes       Ears:    Normal TM's and external ear canals, both ears  Nose:   Nares normal, septum midline, mucosa normal, no drainage    or sinus tenderness  Throat:   Lips, mucosa, and tongue normal; teeth and gums normal  Neck:   Supple, symmetrical, trachea midline, no adenopathy;       thyroid:  No enlargement/tenderness/nodules; no carotid   bruit or JVD  Back:     Symmetric, no curvature, ROM normal, no CVA tenderness  Lungs:     Clear to auscultation bilaterally, respirations unlabored  Chest wall:    No tenderness or deformity  Heart:    Regular rate and rhythm, S1 and S2 normal, no murmur, rub   or gallop  Abdomen:     Soft, non-tender, bowel sounds active all four quadrants,    no masses, no organomegaly  Genitalia:    Normal male without lesion, discharge or tenderness  Rectal:    Normal tone, normal prostate, no masses or tenderness;   guaiac negative stool  Extremities:   Extremities normal, atraumatic, no  cyanosis or edema  Pulses:   2+ and symmetric all extremities  Skin:   Skin color, texture, turgor normal, no rashes or lesions  Lymph nodes:   Cervical, supraclavicular, and axillary nodes normal  Neurologic:   CNII-XII intact. Normal strength, sensation and reflexes      throughout    Musculoskeletal:  ROM 0-120, Ligaments intact,  Imaging Review Plain radiographs demonstrate severe degenerative joint disease of the left knee. The overall alignment is neutral. The bone quality appears to be excellent for age and reported activity level.  Assessment/Plan: Primary osteoarthritis, left knee   The patient history, physical examination and imaging studies are consistent with advanced degenerative joint disease of the left knee. The patient has failed conservative treatment.  The clearance notes were reviewed.  After discussion with the patient it was felt that Total Knee Replacement was indicated. The procedure,  risks, and benefits of total knee arthroplasty were presented and reviewed. The risks including but not limited to aseptic loosening, infection, blood clots, vascular injury, stiffness, patella tracking problems complications among others were discussed. The patient acknowledged the explanation, agreed to proceed with the plan.  [name] 09/23/2017, 9:15 AM

## 2017-09-24 ENCOUNTER — Encounter (HOSPITAL_COMMUNITY): Payer: Self-pay | Admitting: Orthopedic Surgery

## 2017-09-24 DIAGNOSIS — M1712 Unilateral primary osteoarthritis, left knee: Secondary | ICD-10-CM | POA: Diagnosis not present

## 2017-09-24 LAB — BASIC METABOLIC PANEL
Anion gap: 15 (ref 5–15)
BUN: 20 mg/dL (ref 6–20)
CHLORIDE: 96 mmol/L — AB (ref 101–111)
CO2: 25 mmol/L (ref 22–32)
CREATININE: 1.23 mg/dL (ref 0.61–1.24)
Calcium: 8.9 mg/dL (ref 8.9–10.3)
GFR calc Af Amer: >60 mL/min (ref >60)
GFR calc non Af Amer: 59 mL/min — ABNORMAL LOW (ref >60)
GLUCOSE: 146 mg/dL — AB (ref 65–99)
POTASSIUM: 4.1 mmol/L (ref 3.5–5.1)
Sodium: 136 mmol/L (ref 135–145)

## 2017-09-24 LAB — CBC
HCT: 40.6 % (ref 39.0–52.0)
HEMOGLOBIN: 14 g/dL (ref 13.0–17.0)
MCH: 33.2 pg (ref 26.0–34.0)
MCHC: 34.5 g/dL (ref 30.0–36.0)
MCV: 96.2 fL (ref 78.0–100.0)
Platelets: 177 10*3/uL (ref 150–400)
RBC: 4.22 MIL/uL (ref 4.22–5.81)
RDW: 12.5 % (ref 11.5–15.5)
WBC: 10.9 10*3/uL — ABNORMAL HIGH (ref 4.0–10.5)

## 2017-09-24 MED ORDER — TIZANIDINE HCL 4 MG PO TABS: 4.0000 mg | ORAL_TABLET | Freq: Four times a day (QID) | ORAL | 0 refills | Status: DC | PRN

## 2017-09-24 MED ORDER — ASPIRIN 325 MG PO TBEC: 325.0000 mg | DELAYED_RELEASE_TABLET | Freq: Two times a day (BID) | ORAL | 0 refills | Status: DC

## 2017-09-24 MED ORDER — OXYCODONE HCL 10 MG PO TABS: 10.0000 mg | ORAL_TABLET | ORAL | 0 refills | Status: DC | PRN

## 2017-09-24 NOTE — Op Note (Addendum)
TOTAL KNEE REPLACEMENT OPERATIVE NOTE:  09/23/2017  3:11 PM  PATIENT:  Daniel Johns  67 y.o. male  PRE-OPERATIVE DIAGNOSIS:  primary osteoarthritis left knee  POST-OPERATIVE DIAGNOSIS:  primary osteoarthritis left knee  PROCEDURE:  Procedure(s): TOTAL KNEE ARTHROPLASTY  SURGEON:  Surgeon(s): Dannielle HuhLucey, Steve, MD  PHYSICIAN ASSISTANT: Skip MayerBlair Roberts, [facility]   ANESTHESIA:   spinal  DRAINS: Hemovac  SPECIMEN: None  COUNTS:  Correct  TOURNIQUET:  * Missing tourniquet times found for documented tourniquets in log: [id number] *  DICTATION:  Indication for procedure:    The patient is a 67 y.o. male who has failed conservative treatment for primary osteoarthritis left knee.  Informed consent was obtained prior to anesthesia. The risks versus benefits of the operation were explain and in a way the patient can, and did, understand.   On the implant demand matching protocol, this patient scored 10.  Therefore, this patient was not receive a polyethylene insert with vitamin E which is a high demand implant.  Description of procedure:     The patient was taken to the operating room and placed under anesthesia.  The patient was positioned in the usual fashion taking care that all body parts were adequately padded and/or protected.  I foley catheter was not placed.  A tourniquet was applied and the leg prepped and draped in the usual sterile fashion.  The extremity was exsanguinated with the esmarch and tourniquet inflated to 350 mmHg.  Pre-operative range of motion was normal.  The knee was in 5 degree of mild varus.  A midline incision approximately 6-7 inches long was made with a #10 blade.  A new blade was used to make a parapatellar arthrotomy going 2-3 cm into the quadriceps tendon, over the patella, and alongside the medial aspect of the patellar tendon.  A synovectomy was then performed with the #10 blade and forceps. I then elevated the deep MCL off the medial tibial metaphysis  subperiosteally around to the semimembranosus attachment.    I everted the patella and used calipers to measure patellar thickness.  I used the reamer to ream down to appropriate thickness to recreate the native thickness.  I then removed excess bone with the rongeur and sagittal saw.  I used the appropriately sized template and drilled the three lug holes.  I then put the trial in place and measured the thickness with the calipers to ensure recreation of the native thickness.  The trial was then removed and the patella subluxed and the knee brought into flexion.  A homan retractor was place to retract and protect the patella and lateral structures.  A Z-retractor was place medially to protect the medial structures.  The extra-medullary alignment system was used to make cut the tibial articular surface perpendicular to the anamotic axis of the tibia and in 3 degrees of posterior slope.  The cut surface and alignment jig was removed.  I then used the intramedullary alignment guide to make a 6 valgus cut on the distal femur.  I then marked out the epicondylar axis on the distal femur.  The posterior condylar axis measured 3 degrees.  I then used the anterior referencing sizer and measured the femur to be a size 10.  The 4-In-1 cutting block was screwed into place in external rotation matching the posterior condylar angle, making our cuts perpendicular to the epicondylar axis.  Anterior, posterior and chamfer cuts were made with the sagittal saw.  The cutting block and cut pieces were removed.  A lamina  spreader was placed in 90 degrees of flexion.  The ACL, PCL, menisci, and posterior condylar osteophytes were removed.  A 10 mm spacer blocked was found to offer good flexion and extension gap balance after mild in degree releasing.   The scoop retractor was then placed and the femoral finishing block was pinned in place.  The small sagittal saw was used as well as the lug drill to finish the femur.  The block  and cut surfaces were removed and the medullary canal hole filled with autograft bone from the cut pieces.  The tibia was delivered forward in deep flexion and external rotation.  A size F tray was selected and pinned into place centered on the medial 1/3 of the tibial tubercle.  The reamer and keel was used to prepare the tibia through the tray.    I then trialed with the size 10 femur, size F tibia, a 11 mm insert and the 35 patella.  I had excellent flexion/extension gap balance, excellent patella tracking.  Flexion was full and beyond 120 degrees; extension was zero.  These components were chosen and the staff opened them to me on the back table while the knee was lavaged copiously and the cement mixed.  The soft tissue was infiltrated with 60cc of exparel 1.3% through a 21 gauge needle.  I cemented in the components and removed all excess cement.  The polyethylene tibial component was snapped into place and the knee placed in extension while cement was hardening.  The capsule was infilltrated with 30cc of .25% Marcaine with epinephrine.  A hemovac was place in the joint exiting superolaterally.  A pain pump was place superomedially superficial to the arthrotomy.  Once the cement was hard, the tourniquet was let down.  Hemostasis was obtained.  The arthrotomy was closed with figure-8 #1 vicryl sutures.  The deep soft tissues were closed with #0 vicryls and the subcuticular layer closed with a running #2-0 vicryl.  The skin was reapproximated and closed with skin staples.  The wound was dressed with xeroform, 4 x4's, 2 ABD sponges, a single layer of webril and a TED stocking.   The patient was then awakened, extubated, and taken to the recovery room in stable condition.  BLOOD LOSS:  300cc DRAINS: 1 hemovac, 1 pain catheter COMPLICATIONS:  None.  PLAN OF CARE: Admit for overnight observation  PATIENT DISPOSITION:  PACU - hemodynamically stable.   Delay start of Pharmacological VTE agent (>24hrs)  due to surgical blood loss or risk of bleeding:  not applicable  Please fax a copy of this op note to my office at [phone] (please only include page 1 and 2 of the Case Information op note)

## 2017-09-24 NOTE — Plan of Care (Signed)
  Nutrition: Adequate nutrition will be maintained 09/24/2017 1003 - Progressing by Darrow BussingArcilla, Justene Jensen M, RN   Elimination: Will not experience complications related to bowel motility 09/24/2017 1003 - Progressing by Darrow BussingArcilla, Breeana Sawtelle M, RN   Pain Managment: General experience of comfort will improve 09/24/2017 1003 - Progressing by Darrow BussingArcilla, Mahlik Lenn M, RN   Safety: Ability to remain free from injury will improve 09/24/2017 1003 - Progressing by Darrow BussingArcilla, Jillienne Egner M, RN

## 2017-09-24 NOTE — Progress Notes (Signed)
Physical Therapy Treatment Patient Details Name: Daniel Johns MRN: [id number] DOB: October 09, 1950 Today's Date: 09/24/2017    History of Present Illness Pt s/p lt TKA. PMH - rt TKA, HTN, back surgery, gout    PT Comments    Continuing work on functional mobility and activity tolerance;  Much improved gait and transfers from yesterday's eval; Stair training complete; Questions solicited and answered; OK for dc home from PT standpoint; will plan to focus on therex next session (if he is still here)   Follow Up Recommendations  DC plan and follow up therapy as arranged by surgeon     Equipment Recommendations  None recommended by PT    Recommendations for Other Services       Precautions / Restrictions Precautions Precautions: Knee Restrictions Weight Bearing Restrictions: Yes LLE Weight Bearing: Weight bearing as tolerated  Pt educated to not allow any pillow or bolster under knee for healing with optimal range of motion.    Mobility  Bed Mobility Overal bed mobility: Modified Independent                Transfers Overall transfer level: Needs assistance Equipment used: Rolling walker (2 wheeled) Transfers: Sit to/from Stand Sit to Stand: Supervision         General transfer comment: Good hand placement and good rise  Ambulation/Gait Ambulation/Gait assistance: Min guard;Supervision Ambulation Distance (Feet): 300 Feet Assistive device: Rolling walker (2 wheeled) Gait Pattern/deviations: Step-through pattern     General Gait Details: Much improved L knee stability and stance stability; no buckling noted; cues to activate quad and stand tall on LLE   Stairs Stairs: Yes   Stair Management: One rail Right;Step to pattern;Forwards Number of Stairs: 2(x2) General stair comments: Cues for sequence and rail support; managing well  Wheelchair Mobility    Modified Rankin (Stroke Patients Only)       Balance                                             Cognition Arousal/Alertness: Awake/alert Behavior During Therapy: WFL for tasks assessed/performed Overall Cognitive Status: Within Functional Limits for tasks assessed                                        Exercises Total Joint Exercises Goniometric ROM: Approx 2-92 deg    General Comments        Pertinent Vitals/Pain Pain Assessment: 0-10 Pain Score: 8  Pain Location: lt knee Pain Descriptors / Indicators: Aching Pain Intervention(s): Monitored during session;Patient requesting pain meds-RN notified;Ice applied    Home Living                      Prior Function            PT Goals (current goals can now be found in the care plan section) Acute Rehab PT Goals Patient Stated Goal: return home; back to golf PT Goal Formulation: With patient Time For Goal Achievement: 09/25/17 Potential to Achieve Goals: Good Progress towards PT goals: Progressing toward goals    Frequency    7X/week      PT Plan Current plan remains appropriate    Co-evaluation              AM-PAC PT "6 Clicks"  Daily Activity  Outcome Measure  Difficulty turning over in bed (including adjusting bedclothes, sheets and blankets)?: A Little Difficulty moving from lying on back to sitting on the side of the bed? : A Little Difficulty sitting down on and standing up from a chair with arms (e.g., wheelchair, bedside commode, etc,.)?: None Help needed moving to and from a bed to chair (including a wheelchair)?: None Help needed walking in hospital room?: None Help needed climbing 3-5 steps with a railing? : A Little 6 Click Score: 21    End of Session Equipment Utilized During Treatment: Gait belt Activity Tolerance: Patient tolerated treatment well Patient left: in chair;with call bell/phone within reach Nurse Communication: Mobility status;Patient requests pain meds PT Visit Diagnosis: Other abnormalities of gait and mobility  (R26.89);Pain Pain - Right/Left: Left Pain - part of body: Knee     Time: [id number] PT Time Calculation (min) (ACUTE ONLY): 29 min  Charges:  $Gait Training: 23-37 mins                    G Codes:       Van ClinesHolly Sheronica Corey, PT  Acute Rehabilitation Services Pager [phone] Office [phone]    Levi AlandHolly H Cloys Vera 09/24/2017, 10:47 AM

## 2017-09-24 NOTE — Progress Notes (Signed)
Removed IV, provided discharge education/instructions, all questions and concerns addressed, Pt not in distress, discharged home with belongings accompanied by wife. 

## 2017-09-24 NOTE — Progress Notes (Signed)
SPORTS MEDICINE AND JOINT REPLACEMENT  Daniel SpurlingStephen Lucey, MD    Daniel Nancyolby Robbins, PA-C [address], [place], [place]  [id number]                             [phone]   PROGRESS NOTE  Subjective:  negative for Chest Pain  negative for Shortness of Breath  negative for Nausea/Vomiting   negative for Calf Pain  negative for Bowel Movement   Tolerating Diet: yes         Patient reports pain as 4 on 0-10 scale.    Objective: Vital signs in last 24 hours:    Patient Vitals for the past 24 hrs:  BP Temp Temp src Pulse Resp SpO2 Height Weight  09/24/17 0601 (!) 158/91 98.3 F (36.8 C) Oral 88 20 97 % - -  09/23/17 2000 (!) 146/98 98.5 F (36.9 C) Oral 100 20 95 % - -  09/23/17 1604 (!) 155/90 98.2 F (36.8 C) Oral 90 - 97 % - -  09/23/17 1528 (!) 152/103 98.1 F (36.7 C) Oral 88 - 96 % - -  09/23/17 1515 (!) 147/95 98.2 F (36.8 C) - 89 (!) 29 97 % - -  09/23/17 1506 (!) 146/97 - - 89 (!) 8 96 % - -  09/23/17 1451 (!) 144/97 - - 88 12 97 % - -  09/23/17 1436 (!) 151/98 - - 89 14 98 % - -  09/23/17 1421 (!) 142/86 - - 89 19 96 % - -  09/23/17 1414 133/80 - - 91 15 94 % - -  09/23/17 1406 (!) 147/100 - - 83 10 95 % - -  09/23/17 1351 (!) 141/95 - - 89 12 95 % - -  09/23/17 1336 (!) 142/92 - - 89 19 95 % - -  09/23/17 1321 (!) 146/97 (!) 97.3 F (36.3 C) - 85 13 98 % - -  09/23/17 1010 (!) 138/91 - - 95 19 98 % - -  09/23/17 1005 (!) 144/84 - - 95 18 98 % - -  09/23/17 1000 (!) 162/93 - - 97 19 100 % - -  09/23/17 0955 (!) 154/87 - - 92 18 98 % - -  09/23/17 0843 - - - - - - 5\' 11"  (1.803 m) 103.9 kg (229 lb)  09/23/17 0835 (!) 149/90 98.4 F (36.9 C) Oral 81 20 - - -    @flow {1959:LAST@   Intake/Output from previous day:   01/21 0701 - 01/22 0700 In: 700 [I.V.:700] Out: 450 [Urine:400]   Intake/Output this shift:   No intake/output data recorded.   Intake/Output      01/21 0701 - 01/22 0700 01/22 0701 - 01/23 0700   I.V. (mL/kg) 700 (6.7)    Total  Intake(mL/kg) 700 (6.7)    Urine (mL/kg/hr) 400    Blood 50    Total Output 450    Net +250         Urine Occurrence 1 x       LABORATORY DATA: No results for input(s): WBC, HGB, HCT, PLT in the last 168 hours. No results for input(s): NA, K, CL, CO2, BUN, CREATININE, GLUCOSE, CALCIUM in the last 168 hours. Lab Results  Component Value Date   INR 1.05 11/02/2013    Examination:  General appearance: alert, cooperative and no distress Extremities: extremities normal, atraumatic, no cyanosis or edema  Wound Exam: clean, dry, intact   Drainage:  None: wound tissue dry  Motor Exam: Quadriceps and Hamstrings Intact  Sensory Exam: Superficial Peroneal, Deep Peroneal and Tibial normal   Assessment:    1 Day Post-Op  Procedure(s) (LRB): TOTAL KNEE ARTHROPLASTY (Left)  ADDITIONAL DIAGNOSIS:  Active Problems:   S/P total knee replacement     Plan: Physical Therapy as ordered Weight Bearing as Tolerated (WBAT)  DVT Prophylaxis:  Aspirin  DISCHARGE PLAN: Home  DISCHARGE NEEDS: HHPT   Patient progressing well, expected D/C home today         Daniel Johns 09/24/2017, 7:08 AM

## 2017-09-24 NOTE — Evaluation (Signed)
Occupational Therapy Evaluation and Discharge Summary Patient Details Name: Daniel Johns MRN: [id number] DOB: April 02, 1951 Today's Date: 09/24/2017    History of Present Illness Pt s/p lt TKA. PMH - rt TKA, HTN, back surgery, gout   Clinical Impression   Pt admitted for the above diagnosis and overall is doing very well with adls. Pt is very familiar with the recovery process from TKA as he had other knee done as well. Pt requires distant supervision with adls but no hands on assist at this time.  No further acute OT needed.      Follow Up Recommendations  DC plan and follow up therapy as arranged by surgeon    Equipment Recommendations  None recommended by OT    Recommendations for Other Services       Precautions / Restrictions Precautions Precautions: Knee Restrictions Weight Bearing Restrictions: Yes LLE Weight Bearing: Weight bearing as tolerated      Mobility Bed Mobility Overal bed mobility: Modified Independent             General bed mobility comments: no assist needed.  Transfers Overall transfer level: Needs assistance Equipment used: Rolling walker (2 wheeled) Transfers: Sit to/from Stand Sit to Stand: Supervision         General transfer comment: Good hand placement and good rise    Balance Overall balance assessment: No apparent balance deficits (not formally assessed)                                         ADL either performed or assessed with clinical judgement   ADL Overall ADL's : Needs assistance/impaired Eating/Feeding: Independent;Sitting   Grooming: Wash/dry hands;Wash/dry face;Oral care;Applying deodorant;Brushing hair;Supervision/safety;Standing Grooming Details (indicate cue type and reason): walked to bathroom Upper Body Bathing: Set up;Sitting   Lower Body Bathing: Set up;Sit to/from stand   Upper Body Dressing : Set up;Sitting   Lower Body Dressing: Set up;Sit to/from stand   Toilet Transfer:  Supervision/safety;RW   Toileting- Architect and Hygiene: Supervision/safety;Sit to/from stand   Tub/ Shower Transfer: Walk-in shower;Supervision/safety;Ambulation;Rolling walker   Functional mobility during ADLs: Supervision/safety;Rolling walker General ADL Comments: pt doing very well with all adls.  no hands on assist required.      Vision Baseline Vision/History: No visual deficits Patient Visual Report: No change from baseline Vision Assessment?: No apparent visual deficits     Perception     Praxis      Pertinent Vitals/Pain Pain Assessment: 0-10 Pain Score: 2  Pain Location: lt knee Pain Descriptors / Indicators: Aching Pain Intervention(s): Monitored during session;Repositioned;Ice applied     Hand Dominance Right   Extremity/Trunk Assessment Upper Extremity Assessment Upper Extremity Assessment: Overall WFL for tasks assessed   Lower Extremity Assessment Lower Extremity Assessment: Defer to PT evaluation   Cervical / Trunk Assessment Cervical / Trunk Assessment: Normal   Communication Communication Communication: No difficulties   Cognition Arousal/Alertness: Awake/alert Behavior During Therapy: WFL for tasks assessed/performed Overall Cognitive Status: Within Functional Limits for tasks assessed                                     General Comments  Pt doing well with all adls and adl mobility    Exercises Total Joint Exercises Goniometric ROM: Approx 2-92 deg   Shoulder Instructions  Home Living Family/patient expects to be discharged to:: Private residence Living Arrangements: Spouse/significant other Available Help at Discharge: Family;Available 24 hours/day Type of Home: House Home Access: Stairs to enter Entergy CorporationEntrance Stairs-Number of Steps: 3 Entrance Stairs-Rails: Right;Left Home Layout: Two level Alternate Level Stairs-Number of Steps: 3 Alternate Level Stairs-Rails: Right;Left Bathroom Shower/Tub: Walk-in  shower;Door   Foot LockerBathroom Toilet: Standard     Home Equipment: Environmental consultantWalker - 2 wheels;Bedside commode;Shower seat - built in          Prior Functioning/Environment Level of Independence: Independent                 OT Problem List:        OT Treatment/Interventions:      OT Goals(Current goals can be found in the care plan section) Acute Rehab OT Goals Patient Stated Goal: return home; back to golf OT Goal Formulation: All assessment and education complete, DC therapy  OT Frequency:     Barriers to D/C:            Co-evaluation              AM-PAC PT "6 Clicks" Daily Activity     Outcome Measure Help from another person eating meals?: None Help from another person taking care of personal grooming?: None Help from another person toileting, which includes using toliet, bedpan, or urinal?: None Help from another person bathing (including washing, rinsing, drying)?: None Help from another person to put on and taking off regular upper body clothing?: None Help from another person to put on and taking off regular lower body clothing?: None 6 Click Score: 24   End of Session Equipment Utilized During Treatment: Rolling walker Nurse Communication: Mobility status  Activity Tolerance: Patient tolerated treatment well Patient left: in chair;with call bell/phone within reach;with family/visitor present  OT Visit Diagnosis: Pain Pain - Right/Left: Left Pain - part of body: Knee                Time: 1200-1211 OT Time Calculation (min): 11 min Charges:  OT General Charges $OT Visit: 1 Visit OT Evaluation $OT Eval Low Complexity: 1 Low G-Codes:     Tory EmeraldHolly Sarabella Caprio, OTR/L [phone]  Hope BuddsJones, Aubriella Perezgarcia Anne 09/24/2017, 12:18 PM

## 2017-09-24 NOTE — Care Management Note (Signed)
Case Management Note  Patient Details  Name: Daniel Johns MRN: [id number] Date of Birth: Mar 29, 1951  Subjective/Objective:  67 yr old gentleman s/p left total knee arthroplasty.                  Action/Plan: Case manager spoke with patient concerning discharge plan and DME. Patient was preoperatively setup with [facility], no changes. He has RW and 3in1 from previous Knee surgery, CPM has been delivered to his home. Case manager reminded patient to take bone foam home as well. He will have family support at discharge.    Expected Discharge Date:   09/24/17               Expected Discharge Plan:  Home w Home Health Services  In-House Referral:     Discharge planning Services  CM Consult  Post Acute Care Choice:  Home Health, Durable Medical Equipment Choice offered to:  Patient  DME Arranged:  CPM(Has RW and 3in1) DME Agency:  [employer]  HH Arranged:  PT HH Agency:  [facility] ([facility])  Status of Service:  Completed, signed off  If discussed at [employer] Meetings, dates discussed:    Additional Comments:  Durenda GuthrieBrady, Corley Maffeo Naomi, RN 09/24/2017, 12:43 PM

## 2017-09-24 NOTE — Discharge Summary (Signed)
SPORTS MEDICINE & JOINT REPLACEMENT   Georgena Spurling, MD   Laurier Nancy, PA-C [address] [place], [place], [place]  [address]                             [phone]  PATIENT ID: Daniel Johns        MRN:  [id number]          DOB/AGE: 1950-09-22 / 67 y.o.    DISCHARGE SUMMARY  ADMISSION DATE:    09/23/2017 DISCHARGE DATE:   09/24/2017   ADMISSION DIAGNOSIS: primary osteoarthritis left knee    DISCHARGE DIAGNOSIS:  primary osteoarthritis left knee    ADDITIONAL DIAGNOSIS: Active Problems:   S/P total knee replacement  Past Medical History:  Diagnosis Date  . Arthritis   . History of gout   . Hypertension    takes Lisinopril and HCTZ daily  . Joint pain    takes Hydrocodone as needed and Ibuprofen as well if needed  . Joint swelling   . Osteoarthritis of left knee    Severe  . PONV (postoperative nausea and vomiting)    slow to wake up  . Sciatica    occaionally  . Seasonal allergies    takes Zyrtec daily and Afrin nasal spray daily as needed for congestion  . Sleep apnea    cpap    PROCEDURE: Procedure(s): TOTAL KNEE ARTHROPLASTY on 09/23/2017  CONSULTS:    HISTORY:  See H&P in chart  HOSPITAL COURSE:  GEOVANNI RAHMING is a 67 y.o. admitted on 09/23/2017 and found to have a diagnosis of primary osteoarthritis left knee.  After appropriate laboratory studies were obtained  they were taken to the operating room on 09/23/2017 and underwent Procedure(s): TOTAL KNEE ARTHROPLASTY.   They were given perioperative antibiotics:  Anti-infectives (From admission, onward)   Start     Dose/Rate Route Frequency Ordered Stop   09/23/17 1700  ceFAZolin (ANCEF) IVPB 1 g/50 mL premix     1 g 100 mL/hr over 30 Minutes Intravenous Every 6 hours 09/23/17 1338 09/24/17 0032   09/23/17 0700  ceFAZolin (ANCEF) 3 g in dextrose 5 % 50 mL IVPB     3 g 130 mL/hr over 30 Minutes Intravenous On call to O.R. 09/20/17 1328 09/23/17 1117    .  Patient given tranexamic acid IV or topical and  exparel intra-operatively.  Tolerated the procedure well.    POD# 1: Vital signs were stable.  Patient denied Chest pain, shortness of breath, or calf pain.  Patient was started on Lovenox 30 mg subcutaneously twice daily at 8am.  Consults to PT, OT, and care management were made.  The patient was weight bearing as tolerated.  CPM was placed on the operative leg 0-90 degrees for 6-8 hours a day. When out of the CPM, patient was placed in the foam block to achieve full extension. Incentive spirometry was taught.  Dressing was changed.       POD #2, Continued  PT for ambulation and exercise program.  IV saline locked.  O2 discontinued.    The remainder of the hospital course was dedicated to ambulation and strengthening.   The patient was discharged on 1 Day Post-Op in  Good condition.  Blood products given:none  DIAGNOSTIC STUDIES: Recent vital signs:  Patient Vitals for the past 24 hrs:  BP Temp Temp src Pulse Resp SpO2  09/24/17 0601 (!) 158/91 98.3 F (36.8 C) Oral 88 20  97 %  09/23/17 2000 (!) 146/98 98.5 F (36.9 C) Oral 100 20 95 %  09/23/17 1604 (!) 155/90 98.2 F (36.8 C) Oral 90 - 97 %  09/23/17 1528 (!) 152/103 98.1 F (36.7 C) Oral 88 - 96 %  09/23/17 1515 (!) 147/95 98.2 F (36.8 C) - 89 (!) 29 97 %  09/23/17 1506 (!) 146/97 - - 89 (!) 8 96 %  09/23/17 1451 (!) 144/97 - - 88 12 97 %  09/23/17 1436 (!) 151/98 - - 89 14 98 %  09/23/17 1421 (!) 142/86 - - 89 19 96 %  09/23/17 1414 133/80 - - 91 15 94 %  09/23/17 1406 (!) 147/100 - - 83 10 95 %  09/23/17 1351 (!) 141/95 - - 89 12 95 %  09/23/17 1336 (!) 142/92 - - 89 19 95 %  09/23/17 1321 (!) 146/97 (!) 97.3 F (36.3 C) - 85 13 98 %       Recent laboratory studies: Recent Labs    09/24/17 0750  WBC 10.9*  HGB 14.0  HCT 40.6  PLT 177   Recent Labs    09/24/17 0934  NA 136  K 4.1  CL 96*  CO2 25  BUN 20  CREATININE 1.23  GLUCOSE 146*  CALCIUM 8.9   Lab Results  Component Value Date   INR 1.05  11/02/2013     Recent Radiographic Studies :  No results found.  DISCHARGE INSTRUCTIONS: Discharge Instructions    CPM   Complete by:  As directed    Continuous passive motion machine (CPM):      Use the CPM from 0 to 90 for 4-6 hours per day.      You may increase by 10 per day.  You may break it up into 2 or 3 sessions per day.      Use CPM for 2 weeks or until you are told to stop.   Call MD / Call 911   Complete by:  As directed    If you experience chest pain or shortness of breath, CALL 911 and be transported to the hospital emergency room.  If you develope a fever above 101 F, pus (white drainage) or increased drainage or redness at the wound, or calf pain, call your surgeon's office.   Constipation Prevention   Complete by:  As directed    Drink plenty of fluids.  Prune juice may be helpful.  You may use a stool softener, such as Colace (over the counter) 100 mg twice a day.  Use MiraLax (over the counter) for constipation as needed.   Diet - low sodium heart healthy   Complete by:  As directed    Discharge instructions   Complete by:  As directed    INSTRUCTIONS AFTER JOINT REPLACEMENT   Remove items at home which could result in a fall. This includes throw rugs or furniture in walking pathways ICE to the affected joint every three hours while awake for 30 minutes at a time, for at least the first 3-5 days, and then as needed for pain and swelling.  Continue to use ice for pain and swelling. You may notice swelling that will progress down to the foot and ankle.  This is normal after surgery.  Elevate your leg when you are not up walking on it.   Continue to use the breathing machine you got in the hospital (incentive spirometer) which will help keep your temperature down.  It is common for  your temperature to cycle up and down following surgery, especially at night when you are not up moving around and exerting yourself.  The breathing machine keeps your lungs expanded and your  temperature down.   DIET:  As you were doing prior to hospitalization, we recommend a well-balanced diet.  DRESSING / WOUND CARE / SHOWERING  Keep the surgical dressing until follow up.  The dressing is water proof, so you can shower without any extra covering.  IF THE DRESSING FALLS OFF or the wound gets wet inside, change the dressing with sterile gauze.  Please use good hand washing techniques before changing the dressing.  Do not use any lotions or creams on the incision until instructed by your surgeon.    ACTIVITY  Increase activity slowly as tolerated, but follow the weight bearing instructions below.   No driving for 6 weeks or until further direction given by your physician.  You cannot drive while taking narcotics.  No lifting or carrying greater than 10 lbs. until further directed by your surgeon. Avoid periods of inactivity such as sitting longer than an hour when not asleep. This helps prevent blood clots.  You may return to work once you are authorized by your doctor.     WEIGHT BEARING   Weight bearing as tolerated with assist device (walker, cane, etc) as directed, use it as long as suggested by your surgeon or therapist, typically at least 4-6 weeks.   EXERCISES  Results after joint replacement surgery are often greatly improved when you follow the exercise, range of motion and muscle strengthening exercises prescribed by your doctor. Safety measures are also important to protect the joint from further injury. Any time any of these exercises cause you to have increased pain or swelling, decrease what you are doing until you are comfortable again and then slowly increase them. If you have problems or questions, call your caregiver or physical therapist for advice.   Rehabilitation is important following a joint replacement. After just a few days of immobilization, the muscles of the leg can become weakened and shrink (atrophy).  These exercises are designed to build up the  tone and strength of the thigh and leg muscles and to improve motion. Often times heat used for twenty to thirty minutes before working out will loosen up your tissues and help with improving the range of motion but do not use heat for the first two weeks following surgery (sometimes heat can increase post-operative swelling).   These exercises can be done on a training (exercise) mat, on the floor, on a table or on a bed. Use whatever works the best and is most comfortable for you.    Use music or television while you are exercising so that the exercises are a pleasant break in your day. This will make your life better with the exercises acting as a break in your routine that you can look forward to.   Perform all exercises about fifteen times, three times per day or as directed.  You should exercise both the operative leg and the other leg as well.   Exercises include:   Quad Sets - Tighten up the muscle on the front of the thigh (Quad) and hold for 5-10 seconds.   Straight Leg Raises - With your knee straight (if you were given a brace, keep it on), lift the leg to 60 degrees, hold for 3 seconds, and slowly lower the leg.  Perform this exercise against resistance later as your leg gets  stronger.  Leg Slides: Lying on your back, slowly slide your foot toward your buttocks, bending your knee up off the floor (only go as far as is comfortable). Then slowly slide your foot back down until your leg is flat on the floor again.  Angel Wings: Lying on your back spread your legs to the side as far apart as you can without causing discomfort.  Hamstring Strength:  Lying on your back, push your heel against the floor with your leg straight by tightening up the muscles of your buttocks.  Repeat, but this time bend your knee to a comfortable angle, and push your heel against the floor.  You may put a pillow under the heel to make it more comfortable if necessary.   A rehabilitation program following joint  replacement surgery can speed recovery and prevent re-injury in the future due to weakened muscles. Contact your doctor or a physical therapist for more information on knee rehabilitation.    CONSTIPATION  Constipation is defined medically as fewer than three stools per week and severe constipation as less than one stool per week.  Even if you have a regular bowel pattern at home, your normal regimen is likely to be disrupted due to multiple reasons following surgery.  Combination of anesthesia, postoperative narcotics, change in appetite and fluid intake all can affect your bowels.   YOU MUST use at least one of the following options; they are listed in order of increasing strength to get the job done.  They are all available over the counter, and you may need to use some, POSSIBLY even all of these options:    Drink plenty of fluids (prune juice may be helpful) and high fiber foods Colace 100 mg by mouth twice a day  Senokot for constipation as directed and as needed Dulcolax (bisacodyl), take with full glass of water  Miralax (polyethylene glycol) once or twice a day as needed.  If you have tried all these things and are unable to have a bowel movement in the first 3-4 days after surgery call either your surgeon or your primary doctor.    If you experience loose stools or diarrhea, hold the medications until you stool forms back up.  If your symptoms do not get better within 1 week or if they get worse, check with your doctor.  If you experience "the worst abdominal pain ever" or develop nausea or vomiting, please contact the office immediately for further recommendations for treatment.   ITCHING:  If you experience itching with your medications, try taking only a single pain pill, or even half a pain pill at a time.  You can also use Benadryl over the counter for itching or also to help with sleep.   TED HOSE STOCKINGS:  Use stockings on both legs until for at least 2 weeks or as directed by  physician office. They may be removed at night for sleeping.  MEDICATIONS:  See your medication summary on the "After Visit Summary" that nursing will review with you.  You may have some home medications which will be placed on hold until you complete the course of blood thinner medication.  It is important for you to complete the blood thinner medication as prescribed.  PRECAUTIONS:  If you experience chest pain or shortness of breath - call 911 immediately for transfer to the hospital emergency department.   If you develop a fever greater that 101 F, purulent drainage from wound, increased redness or drainage from wound, foul odor  from the wound/dressing, or calf pain - CONTACT YOUR SURGEON.                                                   FOLLOW-UP APPOINTMENTS:  If you do not already have a post-op appointment, please call the office for an appointment to be seen by your surgeon.  Guidelines for how soon to be seen are listed in your "After Visit Summary", but are typically between 1-4 weeks after surgery.  OTHER INSTRUCTIONS:   Knee Replacement:  Do not place pillow under knee, focus on keeping the knee straight while resting. CPM instructions: 0-90 degrees, 2 hours in the morning, 2 hours in the afternoon, and 2 hours in the evening. Place foam block, curve side up under heel at all times except when in CPM or when walking.  DO NOT modify, tear, cut, or change the foam block in any way.  MAKE SURE YOU:  Understand these instructions.  Get help right away if you are not doing well or get worse.    Thank you for letting us be a part of your medical care team.  It is a privilege we respect greatly.  We hope these instructions will help you stay on track for a fast and full recovery!   Increase activity slowly as tolerated   Complete by:  As directed       DISCHARGE MEDICATIONS:   Allergies as of 09/24/2017   No Known Allergies     Medication List    TAKE these medications    aspirin 325 MG EC tablet Take 1 tablet (325 mg total) by mouth 2 (two) times daily. What changed:    medication strength  how much to take  when to take this   cetirizine 10 MG tablet Commonly known as:  ZYRTEC Take 10 mg by mouth daily.   lisinopril-hydrochlorothiazide 20-12.5 MG tablet Commonly known as:  PRINZIDE,ZESTORETIC Take 1 tablet by mouth daily.   Oxycodone HCl 10 MG Tabs Take 1 tablet (10 mg total) by mouth every 4 (four) hours as needed for moderate pain ((score 4 to 6)).   tiZANidine 4 MG tablet Commonly known as:  ZANAFLEX Take 1 tablet (4 mg total) by mouth every 6 (six) hours as needed for muscle spasms.            Durable Medical Equipment  (From admission, onward)        Start     Ordered   09/23/17 1532  DME Walker rolling  Once    Question:  Patient needs a walker to treat with the following condition  Answer:  S/P total knee replacement   09/23/17 1531   09/23/17 1532  DME 3 n 1  Once     09/23/17 1531   09/23/17 1532  DME Bedside commode  Once    Question:  Patient needs a bedside commode to treat with the following condition  Answer:  S/P total knee replacement   09/23/17 1531      FOLLOW UP VISIT:   Follow-up Information    [facility] Follow up.   Specialty:  Home Health Services Why:  A representative from [facility] will contact you to arrange start date and time for your therapy. Contact information: [address] [place] [address] [place] [address] [phone]  DISPOSITION: HOME VS. SNF  CONDITION:  Good   Guy SandiferColby Alan Evonne Rinks 09/24/2017, 12:51 PM

## 2017-09-24 NOTE — Care Management Obs Status (Signed)
MEDICARE OBSERVATION STATUS NOTIFICATION   Patient Details  Name: Daniel Johns MRN: [id number] Date of Birth: Feb 14, 1951   Medicare Observation Status Notification Given:  Yes    Durenda GuthrieBrady, Aric Jost Naomi, RN 09/24/2017, 12:41 PM

## 2018-03-11 ENCOUNTER — Other Ambulatory Visit: Payer: Self-pay | Admitting: Neurosurgery

## 2018-04-25 NOTE — Pre-Procedure Instructions (Signed)
Daniel RocherJerry W Johns  04/25/2018      [id number]- [place], [place] - [address] [facility] [address] [place] [address] Phone: [phone] Fax: [phone]    Your procedure is scheduled on Tuesday September 3rd.  [facility] at 0530 A.M.  Call this number if you have problems the morning of surgery:  [phone]   Remember:  Do not eat or drink after midnight.    Take these medicines the morning of surgery with A SIP OF WATER   Cetirizine (Zyrtec)  7 days prior to surgery STOP taking any Aspirin(unless otherwise instructed by your surgeon), Aleve, Naproxen, Ibuprofen, Motrin, Advil, Goody's, BC's, all herbal medications, fish oil, and all vitamins     Do not wear jewelry  Do not wear lotions, powders, or colognes, or deodorant.  Do not shave 48 hours prior to surgery.  Men may shave face and neck.  Do not bring valuables to the hospital.  [facility] is not [facility] for any belongings or valuables.  Contacts, dentures or bridgework may not be worn into surgery.  Leave your suitcase in the car.  After surgery it may be brought to your room.  For patients admitted to the hospital, discharge time will be determined by your treatment team.  Patients discharged the day of surgery will not be allowed to drive home.    [place]- Preparing For Surgery  Before surgery, you can play an important role. Because skin is not sterile, your skin needs to be as free of germs as possible. You can reduce the number of germs on your skin by washing with CHG (chlorahexidine gluconate) Soap before surgery.  CHG is an antiseptic cleaner which kills germs and bonds with the skin to continue killing germs even after washing.    Oral Hygiene is also important to reduce your risk of infection.  Remember - BRUSH YOUR TEETH THE MORNING OF SURGERY WITH YOUR REGULAR TOOTHPASTE  Please do not use if you have an  allergy to CHG or antibacterial soaps. If your skin becomes reddened/irritated stop using the CHG.  Do not shave (including legs and underarms) for at least 48 hours prior to first CHG shower. It is OK to shave your face.  Please follow these instructions carefully.   1. Shower the NIGHT BEFORE SURGERY and the MORNING OF SURGERY with CHG.   2. If you chose to wash your hair, wash your hair first as usual with your normal shampoo.  3. After you shampoo, rinse your hair and body thoroughly to remove the shampoo.  4. Use CHG as you would any other liquid soap. You can apply CHG directly to the skin and wash gently with a scrungie or a clean washcloth.   5. Apply the CHG Soap to your body ONLY FROM THE NECK DOWN.  Do not use on open wounds or open sores. Avoid contact with your eyes, ears, mouth and genitals (private parts). Wash Face and genitals (private parts)  with your normal soap.  6. Wash thoroughly, paying special attention to the area where your surgery will be performed.  7. Thoroughly rinse your body with warm water from the neck down.  8. DO NOT shower/wash with your normal soap after using and rinsing off the CHG Soap.  9. Pat yourself dry with a CLEAN TOWEL.  10. Wear CLEAN PAJAMAS to bed the night  before surgery, wear comfortable clothes the morning of surgery  11. Place CLEAN SHEETS on your bed the night of your first shower and DO NOT SLEEP WITH PETS.    Day of Surgery:  Do not apply any deodorants/lotions.  Please wear clean clothes to the hospital/surgery center.   Remember to brush your teeth WITH YOUR REGULAR TOOTHPASTE.   Please read over the following fact sheets that you were given. Coughing and Deep Breathing, MRSA Information and Surgical Site Infection Prevention

## 2018-04-28 ENCOUNTER — Other Ambulatory Visit: Payer: Self-pay

## 2018-04-28 ENCOUNTER — Encounter (HOSPITAL_COMMUNITY): Payer: Self-pay

## 2018-04-28 ENCOUNTER — Encounter (HOSPITAL_COMMUNITY)
Admission: RE | Admit: 2018-04-28 | Discharge: 2018-04-28 | Disposition: A | Discharge status: Home / Self Care | Payer: Medicare Other | Source: Ambulatory Visit | Attending: Neurosurgery | Admitting: Neurosurgery

## 2018-04-28 DIAGNOSIS — M48061 Spinal stenosis, lumbar region without neurogenic claudication: Secondary | ICD-10-CM | POA: Diagnosis not present

## 2018-04-28 DIAGNOSIS — Z01818 Encounter for other preprocedural examination: Secondary | ICD-10-CM | POA: Insufficient documentation

## 2018-04-28 LAB — BASIC METABOLIC PANEL
ANION GAP: 10 (ref 5–15)
BUN: 11 mg/dL (ref 8–23)
CO2: 31 mmol/L (ref 22–32)
Calcium: 9.6 mg/dL (ref 8.9–10.3)
Chloride: 95 mmol/L — ABNORMAL LOW (ref 98–111)
Creatinine, Ser: 0.95 mg/dL (ref 0.61–1.24)
GFR calc Af Amer: >60 mL/min (ref >60)
GFR calc non Af Amer: >60 mL/min (ref >60)
GLUCOSE: 112 mg/dL — AB (ref 70–99)
POTASSIUM: 4.2 mmol/L (ref 3.5–5.1)
Sodium: 136 mmol/L (ref 135–145)

## 2018-04-28 LAB — CBC
HEMATOCRIT: 50.5 % (ref 39.0–52.0)
Hemoglobin: 17 g/dL (ref 13.0–17.0)
MCH: 31.3 pg (ref 26.0–34.0)
MCHC: 33.7 g/dL (ref 30.0–36.0)
MCV: 93 fL (ref 78.0–100.0)
Platelets: 186 10*3/uL (ref 150–400)
RBC: 5.43 MIL/uL (ref 4.22–5.81)
RDW: 13 % (ref 11.5–15.5)
WBC: 5.5 10*3/uL (ref 4.0–10.5)

## 2018-04-28 LAB — SURGICAL PCR SCREEN
MRSA, PCR: NEGATIVE
Staphylococcus aureus: NEGATIVE

## 2018-04-28 LAB — TYPE AND SCREEN
ABO/RH(D): O NEG
Antibody Screen: NEGATIVE

## 2018-04-28 NOTE — Pre-Procedure Instructions (Signed)
Daniel RocherJerry W Johns  04/28/2018      [id number] [place], [place] - [address] [facility] [address] [place] [address] Phone: [phone] Fax: [phone]    Your procedure is scheduled on Tuesday September 3rd.  [facility] at [facility] A.M.  Call this number if you have problems the morning of surgery:  [phone]   Remember:  Do not eat or drink after midnight.    Take these medicines the morning of surgery with A SIP OF WATER   Cetirizine (Zyrtec)  7 days prior to surgery STOP taking any  Aleve, Naproxen, Ibuprofen, Motrin, Advil, Goody's, BC's, all herbal medications, fish oil, and all vitamins  Follow your surgeon's instructions on when to stop Asprin.  If no instructions were given by your surgeon then you will need to call the office to get those instructions.        Do not wear jewelry  Do not wear lotions, powders, or colognes, or deodorant.  Men may shave face and neck.  Do not bring valuables to the hospital.  [facility] is not responsible for any belongings or valuables.  Contacts, dentures or bridgework may not be worn into surgery.  Leave your suitcase in the car.  After surgery it may be brought to your room.  For patients admitted to the hospital, discharge time will be determined by your treatment team.  Patients discharged the day of surgery will not be allowed to drive home.    - Preparing For Surgery  Before surgery, you can play an important role. Because skin is not sterile, your skin needs to be as free of germs as possible. You can reduce the number of germs on your skin by washing with CHG (chlorahexidine gluconate) Soap before surgery.  CHG is an antiseptic cleaner which kills germs and bonds with the skin to continue killing germs even after washing.    Oral Hygiene is also important to reduce your risk of infection.  Remember - BRUSH YOUR TEETH THE  MORNING OF SURGERY WITH YOUR REGULAR TOOTHPASTE  Please do not use if you have an allergy to CHG or antibacterial soaps. If your skin becomes reddened/irritated stop using the CHG.  Do not shave (including legs and underarms) for at least 48 hours prior to first CHG shower. It is OK to shave your face.  Please follow these instructions carefully.   1. Shower the NIGHT BEFORE SURGERY and the MORNING OF SURGERY with CHG.   2. If you chose to wash your hair, wash your hair first as usual with your normal shampoo.  3. After you shampoo, rinse your hair and body thoroughly to remove the shampoo.  4. Use CHG as you would any other liquid soap. You can apply CHG directly to the skin and wash gently with a scrungie or a clean washcloth.   5. Apply the CHG Soap to your body ONLY FROM THE NECK DOWN.  Do not use on open wounds or open sores. Avoid contact with your eyes, ears, mouth and genitals (private parts). Wash Face and genitals (private parts)  with your normal soap.  6. Wash thoroughly, paying special attention to the area where your surgery will be performed.  7. Thoroughly rinse your body with warm water from the neck down.  8. DO NOT shower/wash with your normal soap after using and rinsing off the  CHG Soap.  9. Pat yourself dry with a CLEAN TOWEL.  10. Wear CLEAN PAJAMAS to bed the night before surgery, wear comfortable clothes the morning of surgery  11. Place CLEAN SHEETS on your bed the night of your first shower and DO NOT SLEEP WITH PETS.    Day of Surgery:  Do not apply any deodorants/lotions.  Please wear clean clothes to the hospital/surgery center.   Remember to brush your teeth WITH YOUR REGULAR TOOTHPASTE.   Please read over the following fact sheets that you were given.

## 2018-04-28 NOTE — Progress Notes (Signed)
PCP - [place] QuaByron Hoffman Cardiologist - denies  Chest x-ray - not needed EKG - requesting Stress Test - > 15 years ECHO - 2015 Cardiac Cath - denies  Sleep Study - 2018 - care everywhere CPAP - at night - instructed to bring mask and tubing the morning of surgery  Aspirin Instructions: last dose 04/26/18  Anesthesia review: yes, EKG requested  Patient denies shortness of breath, fever, cough and chest pain at PAT appointment   Patient verbalized understanding of instructions that were given to them at the PAT appointment. Patient was also instructed that they will need to review over the PAT instructions again at home before surgery.

## 2018-04-29 NOTE — Progress Notes (Signed)
Anesthesia Chart Review:  Case:  [id number] Date/Time:  05/06/18 0715   Procedures:      Right Lumbar 3-4 Lumbar 4-5 Anterolateral lumbar interbody decompression fusion with percutaneous pedicle screws (Right ) - Right Lumbar 3-4 Lumbar 4-5 Anterolateral lumbar interbody decompression fusion with percutaneous pedicle screws     LUMBAR PERCUTANEOUS PEDICLE SCREW 2 LEVEL (N/A )   Anesthesia type:  General   Pre-op diagnosis:  Spinal stenosis, Lumbar region without neurogenic claudication   Location:  [facility] ROOM 21 / [facility]   Surgeon:  Maeola Harman, MD      DISCUSSION: 67yo male never smoker for above procedure. Pertinent hx includes HTN, OSA on CPAP, post-op N/V. S/p R TKA 11/09/13, S/p L TKA 09/23/2017.   Pt was previously cleared by Wynn Banker, FNP on 07/31/2017 prior to L TKA on 09/23/2017. Her note in care everywhere states "Cleared for knee surgery without concerns for medical or cardiac complications at time of exam. ECG 12-lead with mild bradycardia (56-58 bpm) but no arrythmia."  Anticipate he can proceed with surgery barring acute status change.  VS: BP (!) 146/88   Pulse 66   Temp (!) 25 C   Resp 20   Ht 5\' 11"  (1.803 m)   Wt 101.6 kg   SpO2 99%   BMI 31.23 kg/m   PROVIDERS: Lindwood Qua, MD is PCP, last seen in office by Wynn Banker, FNP 09/17/17   LABS: Labs reviewed: Acceptable for surgery. (all labs ordered are listed, but only abnormal results are displayed)  Labs Reviewed  BASIC METABOLIC PANEL - Abnormal; Notable for the following components:      Result Value   Chloride 95 (*)    Glucose, Bld 112 (*)    All other components within normal limits  SURGICAL PCR SCREEN  CBC  TYPE AND SCREEN     IMAGES: N/A   EKG: 07/31/17 [facility] medical specialist): Sinus bradycardia (56 bpm).  CV: Echo 10/01/13 (care everywhere):  - LVH. Normal LV EF(55-60%).  Diastolic LV dysfunction.  Elevated LV filling pressures. - Mitralvalve is mildlythickened withnormal  leafletmobility. No echocardiographic evidence of pathologic mitral regurgitation. - Dilated LA. - Thoracicaorta is Recruitment consultant.0 cm atthe LVoutflow tract,3.3 cm at thesinuses of Valsalva,4.1 cmabovethe sinotublar junctionand 2.7 cmat the transversearch - Trivial pulmonary regurgitation.   - Normal RV contractile performance.   - Trivial tricuspid regurgitation  Past Medical History:  Diagnosis Date  . Arthritis   . History of gout   . Hypertension    takes Lisinopril and HCTZ daily  . Joint pain    takes Hydrocodone as needed and Ibuprofen as well if needed  . Joint swelling   . Osteoarthritis of left knee    Severe  . PONV (postoperative nausea and vomiting)    slow to wake up hx  . Sciatica    occaionally  . Seasonal allergies    takes Zyrtec daily and Afrin nasal spray daily as needed for congestion  . Sleep apnea    cpap    Past Surgical History:  Procedure Laterality Date  . BACK SURGERY     neck surgery  . bone spurs removed from left shoulder    . KNEE ARTHROSCOPY     x 2  . left knee arthroscopy     x 1  . sty removed from left eye    . TONSILLECTOMY    . TOTAL KNEE ARTHROPLASTY Right 11/09/2013   Procedure: TOTAL KNEE ARTHROPLASTY;  Surgeon: Dannielle Huh,  MD;  Location: [facility];  Service: Orthopedics;  Laterality: Right;  . TOTAL KNEE ARTHROPLASTY Left 09/23/2017   Procedure: TOTAL KNEE ARTHROPLASTY;  Surgeon: Dannielle HuhLucey, Steve, MD;  Location: [facility];  Service: Orthopedics;  Laterality: Left;    MEDICATIONS: . aspirin EC 325 MG EC tablet  . aspirin EC 81 MG tablet  . cetirizine (ZYRTEC) 10 MG tablet  . ibuprofen (ADVIL,MOTRIN) 200 MG tablet  . lisinopril-hydrochlorothiazide (PRINZIDE,ZESTORETIC) 20-12.5 MG tablet   No current facility-administered medications for this encounter.      Zannie CoveJames Janyla Biscoe, PA-C [facility] Phone [phone] 04/29/2018 11:25 AM

## 2018-05-06 ENCOUNTER — Inpatient Hospital Stay (HOSPITAL_COMMUNITY): Payer: Medicare Other

## 2018-05-06 ENCOUNTER — Encounter (HOSPITAL_COMMUNITY): Payer: Self-pay | Admitting: *Deleted

## 2018-05-06 ENCOUNTER — Encounter (HOSPITAL_COMMUNITY)
Admission: RE | Disposition: A | Discharge status: Home / Self Care | Payer: Self-pay | Source: Ambulatory Visit | Attending: Neurosurgery

## 2018-05-06 ENCOUNTER — Inpatient Hospital Stay (HOSPITAL_COMMUNITY)
Admission: RE | Admit: 2018-05-06 | Discharge: 2018-05-07 | DRG: 460 | Disposition: A | Discharge status: Home / Self Care | Payer: Medicare Other | Source: Ambulatory Visit | Attending: Neurosurgery | Admitting: Neurosurgery

## 2018-05-06 ENCOUNTER — Inpatient Hospital Stay (HOSPITAL_COMMUNITY): Payer: Medicare Other | Admitting: Certified Registered Nurse Anesthetist

## 2018-05-06 ENCOUNTER — Inpatient Hospital Stay (HOSPITAL_COMMUNITY): Payer: Medicare Other | Admitting: Physician Assistant

## 2018-05-06 DIAGNOSIS — M419 Scoliosis, unspecified: Secondary | ICD-10-CM | POA: Diagnosis present

## 2018-05-06 DIAGNOSIS — Z8249 Family history of ischemic heart disease and other diseases of the circulatory system: Secondary | ICD-10-CM | POA: Diagnosis not present

## 2018-05-06 DIAGNOSIS — G4730 Sleep apnea, unspecified: Secondary | ICD-10-CM | POA: Diagnosis present

## 2018-05-06 DIAGNOSIS — S32049A Unspecified fracture of fourth lumbar vertebra, initial encounter for closed fracture: Secondary | ICD-10-CM | POA: Diagnosis present

## 2018-05-06 DIAGNOSIS — I10 Essential (primary) hypertension: Secondary | ICD-10-CM | POA: Diagnosis present

## 2018-05-06 DIAGNOSIS — Z7982 Long term (current) use of aspirin: Secondary | ICD-10-CM

## 2018-05-06 DIAGNOSIS — Z96652 Presence of left artificial knee joint: Secondary | ICD-10-CM | POA: Diagnosis present

## 2018-05-06 DIAGNOSIS — M48061 Spinal stenosis, lumbar region without neurogenic claudication: Secondary | ICD-10-CM | POA: Diagnosis present

## 2018-05-06 DIAGNOSIS — M5117 Intervertebral disc disorders with radiculopathy, lumbosacral region: Secondary | ICD-10-CM | POA: Diagnosis present

## 2018-05-06 DIAGNOSIS — M549 Dorsalgia, unspecified: Secondary | ICD-10-CM | POA: Diagnosis present

## 2018-05-06 DIAGNOSIS — Z419 Encounter for procedure for purposes other than remedying health state, unspecified: Secondary | ICD-10-CM

## 2018-05-06 HISTORY — PX: LUMBAR PERCUTANEOUS PEDICLE SCREW 2 LEVEL: SHX5561

## 2018-05-06 HISTORY — PX: ANTERIOR LAT LUMBAR FUSION: SHX1168

## 2018-05-06 SURGERY — ANTERIOR LATERAL LUMBAR FUSION 2 LEVELS
Anesthesia: General | Site: Spine Lumbar | Laterality: Right

## 2018-05-06 MED ORDER — ACETAMINOPHEN 650 MG RE SUPP: 650.0000 mg | RECTAL | Status: DC | PRN

## 2018-05-06 MED ORDER — HYDROMORPHONE HCL 1 MG/ML IJ SOLN
INTRAMUSCULAR | Status: AC
  Filled 2018-05-06: qty 1

## 2018-05-06 MED ORDER — LIDOCAINE 2% (20 MG/ML) 5 ML SYRINGE
INTRAMUSCULAR | Status: AC
  Filled 2018-05-06: qty 5

## 2018-05-06 MED ORDER — FENTANYL CITRATE (PF) 250 MCG/5ML IJ SOLN
INTRAMUSCULAR | Status: AC
  Filled 2018-05-06: qty 5

## 2018-05-06 MED ORDER — LIDOCAINE-EPINEPHRINE 1 %-1:100000 IJ SOLN
INTRAMUSCULAR | Status: DC | PRN
  Administered 2018-05-06: 13 mL

## 2018-05-06 MED ORDER — LISINOPRIL-HYDROCHLOROTHIAZIDE 20-12.5 MG PO TABS: 1.0000 | ORAL_TABLET | Freq: Every day | ORAL | Status: DC

## 2018-05-06 MED ORDER — DEXAMETHASONE SODIUM PHOSPHATE 10 MG/ML IJ SOLN
INTRAMUSCULAR | Status: DC | PRN
  Administered 2018-05-06: 10 mg via INTRAVENOUS

## 2018-05-06 MED ORDER — METHOCARBAMOL 1000 MG/10ML IJ SOLN: 500.0000 mg | Freq: Four times a day (QID) | INTRAVENOUS | Status: DC | PRN

## 2018-05-06 MED ORDER — CHLORHEXIDINE GLUCONATE CLOTH 2 % EX PADS: 6.0000 | MEDICATED_PAD | Freq: Once | CUTANEOUS | Status: DC

## 2018-05-06 MED ORDER — HYDROMORPHONE HCL 1 MG/ML IJ SOLN
0.2500 mg | INTRAMUSCULAR | Status: DC | PRN
  Administered 2018-05-06: 0.25 mg via INTRAVENOUS
  Administered 2018-05-06 (×2): 0.5 mg via INTRAVENOUS
  Administered 2018-05-06: 0.25 mg via INTRAVENOUS

## 2018-05-06 MED ORDER — PHENOL 1.4 % MT LIQD: 1.0000 | OROMUCOSAL | Status: DC | PRN

## 2018-05-06 MED ORDER — MENTHOL 3 MG MT LOZG: 1.0000 | LOZENGE | OROMUCOSAL | Status: DC | PRN

## 2018-05-06 MED ORDER — ONDANSETRON HCL 4 MG/2ML IJ SOLN
INTRAMUSCULAR | Status: AC
  Filled 2018-05-06: qty 2

## 2018-05-06 MED ORDER — PROPOFOL 10 MG/ML IV BOLUS
INTRAVENOUS | Status: AC
  Filled 2018-05-06: qty 20

## 2018-05-06 MED ORDER — BUPIVACAINE HCL (PF) 0.5 % IJ SOLN
INTRAMUSCULAR | Status: DC | PRN
  Administered 2018-05-06: 13 mL

## 2018-05-06 MED ORDER — SUCCINYLCHOLINE CHLORIDE 200 MG/10ML IV SOSY
PREFILLED_SYRINGE | INTRAVENOUS | Status: AC
  Filled 2018-05-06: qty 10

## 2018-05-06 MED ORDER — CEFAZOLIN SODIUM-DEXTROSE 2-4 GM/100ML-% IV SOLN
2.0000 g | INTRAVENOUS | Status: AC
  Administered 2018-05-06: 2 g via INTRAVENOUS

## 2018-05-06 MED ORDER — KETOROLAC TROMETHAMINE 30 MG/ML IJ SOLN
INTRAMUSCULAR | Status: AC
  Filled 2018-05-06: qty 1

## 2018-05-06 MED ORDER — MIDAZOLAM HCL 5 MG/5ML IJ SOLN
INTRAMUSCULAR | Status: DC | PRN
  Administered 2018-05-06: 2 mg via INTRAVENOUS

## 2018-05-06 MED ORDER — OXYCODONE HCL 5 MG PO TABS
10.0000 mg | ORAL_TABLET | ORAL | Status: DC | PRN
  Administered 2018-05-06 – 2018-05-07 (×7): 10 mg via ORAL
  Filled 2018-05-06 (×6): qty 2

## 2018-05-06 MED ORDER — POLYETHYLENE GLYCOL 3350 17 G PO PACK: 17.0000 g | PACK | Freq: Every day | ORAL | Status: DC | PRN

## 2018-05-06 MED ORDER — FAMOTIDINE 20 MG PO TABS
20.0000 mg | ORAL_TABLET | Freq: Two times a day (BID) | ORAL | Status: DC
  Administered 2018-05-06 (×2): 20 mg via ORAL
  Filled 2018-05-06 (×2): qty 1

## 2018-05-06 MED ORDER — PHENYLEPHRINE 40 MCG/ML (10ML) SYRINGE FOR IV PUSH (FOR BLOOD PRESSURE SUPPORT)
PREFILLED_SYRINGE | INTRAVENOUS | Status: AC
  Filled 2018-05-06: qty 10

## 2018-05-06 MED ORDER — HYDROXYZINE HCL 50 MG/ML IM SOLN
50.0000 mg | Freq: Four times a day (QID) | INTRAMUSCULAR | Status: DC | PRN
  Administered 2018-05-06: 50 mg via INTRAMUSCULAR
  Filled 2018-05-06: qty 1

## 2018-05-06 MED ORDER — SODIUM CHLORIDE 0.9 % IV SOLN: 250.0000 mL | INTRAVENOUS | Status: DC

## 2018-05-06 MED ORDER — LIDOCAINE-EPINEPHRINE 1 %-1:100000 IJ SOLN
INTRAMUSCULAR | Status: AC
  Filled 2018-05-06: qty 1

## 2018-05-06 MED ORDER — PNEUMOCOCCAL VAC POLYVALENT 25 MCG/0.5ML IJ INJ: 0.5000 mL | INJECTION | INTRAMUSCULAR | Status: DC

## 2018-05-06 MED ORDER — HYDROCHLOROTHIAZIDE 12.5 MG PO CAPS: 12.5000 mg | ORAL_CAPSULE | Freq: Every day | ORAL | Status: DC

## 2018-05-06 MED ORDER — 0.9 % SODIUM CHLORIDE (POUR BTL) OPTIME
TOPICAL | Status: DC | PRN
  Administered 2018-05-06 (×2): 1000 mL

## 2018-05-06 MED ORDER — FLEET ENEMA 7-19 GM/118ML RE ENEM: 1.0000 | ENEMA | Freq: Once | RECTAL | Status: DC | PRN

## 2018-05-06 MED ORDER — OXYCODONE HCL 5 MG PO TABS
ORAL_TABLET | ORAL | Status: AC
  Filled 2018-05-06: qty 2

## 2018-05-06 MED ORDER — PROPOFOL 10 MG/ML IV BOLUS
INTRAVENOUS | Status: DC | PRN
  Administered 2018-05-06: 200 mg via INTRAVENOUS

## 2018-05-06 MED ORDER — DOCUSATE SODIUM 100 MG PO CAPS
100.0000 mg | ORAL_CAPSULE | Freq: Two times a day (BID) | ORAL | Status: DC
  Administered 2018-05-06 (×2): 100 mg via ORAL
  Filled 2018-05-06 (×2): qty 1

## 2018-05-06 MED ORDER — SODIUM CHLORIDE 0.9% FLUSH
3.0000 mL | Freq: Two times a day (BID) | INTRAVENOUS | Status: DC
  Administered 2018-05-06: 3 mL via INTRAVENOUS

## 2018-05-06 MED ORDER — LIDOCAINE HCL (CARDIAC) PF 100 MG/5ML IV SOSY
PREFILLED_SYRINGE | INTRAVENOUS | Status: DC | PRN
  Administered 2018-05-06: 100 mg via INTRAVENOUS

## 2018-05-06 MED ORDER — ONDANSETRON HCL 4 MG PO TABS: 4.0000 mg | ORAL_TABLET | Freq: Four times a day (QID) | ORAL | Status: DC | PRN

## 2018-05-06 MED ORDER — BISACODYL 10 MG RE SUPP: 10.0000 mg | Freq: Every day | RECTAL | Status: DC | PRN

## 2018-05-06 MED ORDER — ONDANSETRON HCL 4 MG/2ML IJ SOLN
INTRAMUSCULAR | Status: DC | PRN
  Administered 2018-05-06: 4 mg via INTRAVENOUS

## 2018-05-06 MED ORDER — BUPIVACAINE HCL (PF) 0.5 % IJ SOLN
INTRAMUSCULAR | Status: AC
  Filled 2018-05-06: qty 30

## 2018-05-06 MED ORDER — SODIUM CHLORIDE 0.9% FLUSH
3.0000 mL | INTRAVENOUS | Status: DC | PRN
  Administered 2018-05-06: 3 mL via INTRAVENOUS
  Filled 2018-05-06: qty 3

## 2018-05-06 MED ORDER — BUPIVACAINE LIPOSOME 1.3 % IJ SUSP
INTRAMUSCULAR | Status: DC | PRN
  Administered 2018-05-06: 10 mL

## 2018-05-06 MED ORDER — LACTATED RINGERS IV SOLN
INTRAVENOUS | Status: DC | PRN
  Administered 2018-05-06 (×4): via INTRAVENOUS

## 2018-05-06 MED ORDER — ASPIRIN EC 81 MG PO TBEC: 81.0000 mg | DELAYED_RELEASE_TABLET | Freq: Every day | ORAL | Status: DC

## 2018-05-06 MED ORDER — ONDANSETRON HCL 4 MG/2ML IJ SOLN: 4.0000 mg | Freq: Four times a day (QID) | INTRAMUSCULAR | Status: DC | PRN

## 2018-05-06 MED ORDER — ACETAMINOPHEN 500 MG PO TABS
1000.0000 mg | ORAL_TABLET | Freq: Four times a day (QID) | ORAL | Status: DC
  Administered 2018-05-06 – 2018-05-07 (×3): 1000 mg via ORAL
  Filled 2018-05-06 (×3): qty 2

## 2018-05-06 MED ORDER — MIDAZOLAM HCL 2 MG/2ML IJ SOLN
INTRAMUSCULAR | Status: AC
  Filled 2018-05-06: qty 2

## 2018-05-06 MED ORDER — CEFAZOLIN SODIUM-DEXTROSE 2-4 GM/100ML-% IV SOLN
2.0000 g | Freq: Three times a day (TID) | INTRAVENOUS | Status: AC
  Administered 2018-05-06 (×2): 2 g via INTRAVENOUS
  Filled 2018-05-06 (×2): qty 100

## 2018-05-06 MED ORDER — DEXAMETHASONE SODIUM PHOSPHATE 10 MG/ML IJ SOLN
INTRAMUSCULAR | Status: AC
  Filled 2018-05-06: qty 1

## 2018-05-06 MED ORDER — ZOLPIDEM TARTRATE 5 MG PO TABS: 5.0000 mg | ORAL_TABLET | Freq: Every evening | ORAL | Status: DC | PRN

## 2018-05-06 MED ORDER — KCL IN DEXTROSE-NACL 20-5-0.45 MEQ/L-%-% IV SOLN: INTRAVENOUS | Status: DC

## 2018-05-06 MED ORDER — FAMOTIDINE IN NACL 20-0.9 MG/50ML-% IV SOLN: 20.0000 mg | Freq: Two times a day (BID) | INTRAVENOUS | Status: DC

## 2018-05-06 MED ORDER — ALUM & MAG HYDROXIDE-SIMETH 200-200-20 MG/5ML PO SUSP: 30.0000 mL | Freq: Four times a day (QID) | ORAL | Status: DC | PRN

## 2018-05-06 MED ORDER — GLYCOPYRROLATE PF 0.2 MG/ML IJ SOSY
PREFILLED_SYRINGE | INTRAMUSCULAR | Status: AC
  Filled 2018-05-06: qty 1

## 2018-05-06 MED ORDER — MORPHINE SULFATE (PF) 2 MG/ML IV SOLN
2.0000 mg | INTRAVENOUS | Status: DC | PRN
  Administered 2018-05-06: 2 mg via INTRAVENOUS
  Filled 2018-05-06 (×2): qty 1

## 2018-05-06 MED ORDER — LORATADINE 10 MG PO TABS: 10.0000 mg | ORAL_TABLET | Freq: Every day | ORAL | Status: DC

## 2018-05-06 MED ORDER — FENTANYL CITRATE (PF) 100 MCG/2ML IJ SOLN
INTRAMUSCULAR | Status: DC | PRN
  Administered 2018-05-06: 50 ug via INTRAVENOUS
  Administered 2018-05-06: 150 ug via INTRAVENOUS
  Administered 2018-05-06 (×4): 50 ug via INTRAVENOUS

## 2018-05-06 MED ORDER — HYDROXYZINE HCL 25 MG PO TABS: 25.0000 mg | ORAL_TABLET | Freq: Three times a day (TID) | ORAL | Status: DC | PRN

## 2018-05-06 MED ORDER — ACETAMINOPHEN 325 MG PO TABS: 650.0000 mg | ORAL_TABLET | ORAL | Status: DC | PRN

## 2018-05-06 MED ORDER — EPHEDRINE SULFATE-NACL 50-0.9 MG/10ML-% IV SOSY
PREFILLED_SYRINGE | INTRAVENOUS | Status: DC | PRN
  Administered 2018-05-06 (×2): 10 mg via INTRAVENOUS
  Administered 2018-05-06: 15 mg via INTRAVENOUS

## 2018-05-06 MED ORDER — BUPIVACAINE LIPOSOME 1.3 % IJ SUSP
20.0000 mL | Freq: Once | INTRAMUSCULAR | Status: DC
  Filled 2018-05-06: qty 20

## 2018-05-06 MED ORDER — OXYCODONE HCL 5 MG PO TABS: 5.0000 mg | ORAL_TABLET | ORAL | Status: DC | PRN

## 2018-05-06 MED ORDER — METHOCARBAMOL 500 MG PO TABS
500.0000 mg | ORAL_TABLET | Freq: Four times a day (QID) | ORAL | Status: DC | PRN
  Administered 2018-05-06 – 2018-05-07 (×4): 500 mg via ORAL
  Filled 2018-05-06 (×3): qty 1

## 2018-05-06 MED ORDER — METHOCARBAMOL 500 MG PO TABS
ORAL_TABLET | ORAL | Status: AC
  Filled 2018-05-06: qty 1

## 2018-05-06 MED ORDER — SUCCINYLCHOLINE CHLORIDE 200 MG/10ML IV SOSY
PREFILLED_SYRINGE | INTRAVENOUS | Status: DC | PRN
  Administered 2018-05-06: 120 mg via INTRAVENOUS

## 2018-05-06 MED ORDER — HEMOSTATIC AGENTS (NO CHARGE) OPTIME
TOPICAL | Status: DC | PRN
  Administered 2018-05-06: 1

## 2018-05-06 MED ORDER — LISINOPRIL 20 MG PO TABS: 20.0000 mg | ORAL_TABLET | Freq: Every day | ORAL | Status: DC

## 2018-05-06 MED ORDER — EPHEDRINE 5 MG/ML INJ
INTRAVENOUS | Status: AC
  Filled 2018-05-06: qty 10

## 2018-05-06 MED ORDER — ROCURONIUM BROMIDE 50 MG/5ML IV SOSY
PREFILLED_SYRINGE | INTRAVENOUS | Status: AC
  Filled 2018-05-06: qty 5

## 2018-05-06 SURGICAL SUPPLY — 82 items
ADH SKN CLS APL DERMABOND .7 (GAUZE/BANDAGES/DRESSINGS) ×4
AGENT HMST KT MTR STRL THRMB (HEMOSTASIS) ×2
BLADE CLIPPER SURG (BLADE) IMPLANT
CAGE MODULUS XLW 10X22X60 - 10 (Cage) ×2 IMPLANT
CARTRIDGE OIL MAESTRO DRILL (MISCELLANEOUS) ×2 IMPLANT
CLIP NEUROVISION LG (CLIP) ×2 IMPLANT
CONT SPEC 4OZ CLIKSEAL STRL BL (MISCELLANEOUS) ×4 IMPLANT
COVER BACK TABLE 60X90IN (DRAPES) ×4 IMPLANT
DECANTER SPIKE VIAL GLASS SM (MISCELLANEOUS) ×6 IMPLANT
DERMABOND ADVANCED (GAUZE/BANDAGES/DRESSINGS) ×4
DERMABOND ADVANCED .7 DNX12 (GAUZE/BANDAGES/DRESSINGS) ×4 IMPLANT
DIFFUSER DRILL AIR PNEUMATIC (MISCELLANEOUS) ×2 IMPLANT
DRAPE C-ARM 42X72 X-RAY (DRAPES) ×8 IMPLANT
DRAPE C-ARMOR (DRAPES) ×8 IMPLANT
DRAPE LAPAROTOMY 100X72X124 (DRAPES) ×8 IMPLANT
DRAPE POUCH INSTRU U-SHP 10X18 (DRAPES) ×4 IMPLANT
DRAPE SURG 17X23 STRL (DRAPES) ×4 IMPLANT
DRSG OPSITE POSTOP 3X4 (GAUZE/BANDAGES/DRESSINGS) ×4 IMPLANT
DRSG OPSITE POSTOP 4X6 (GAUZE/BANDAGES/DRESSINGS) ×4 IMPLANT
DURAPREP 26ML APPLICATOR (WOUND CARE) ×8 IMPLANT
ELECT REM PT RETURN 9FT ADLT (ELECTROSURGICAL) ×8
ELECTRODE REM PT RTRN 9FT ADLT (ELECTROSURGICAL) ×4 IMPLANT
GAUZE 4X4 16PLY RFD (DISPOSABLE) IMPLANT
GAUZE SPONGE 4X4 12PLY STRL (GAUZE/BANDAGES/DRESSINGS) ×4 IMPLANT
GAUZE SPONGE 4X4 16PLY XRAY LF (GAUZE/BANDAGES/DRESSINGS) ×2 IMPLANT
GLOVE BIO SURGEON STRL SZ7 (GLOVE) ×2 IMPLANT
GLOVE BIO SURGEON STRL SZ7.5 (GLOVE) ×2 IMPLANT
GLOVE BIO SURGEON STRL SZ8 (GLOVE) ×12 IMPLANT
GLOVE BIOGEL PI IND STRL 7.5 (GLOVE) IMPLANT
GLOVE BIOGEL PI IND STRL 8 (GLOVE) ×4 IMPLANT
GLOVE BIOGEL PI IND STRL 8.5 (GLOVE) ×6 IMPLANT
GLOVE BIOGEL PI INDICATOR 7.5 (GLOVE) ×2
GLOVE BIOGEL PI INDICATOR 8 (GLOVE) ×4
GLOVE BIOGEL PI INDICATOR 8.5 (GLOVE) ×6
GLOVE ECLIPSE 8.0 STRL XLNG CF (GLOVE) ×8 IMPLANT
GOWN STRL REUS W/ TWL LRG LVL3 (GOWN DISPOSABLE) IMPLANT
GOWN STRL REUS W/ TWL XL LVL3 (GOWN DISPOSABLE) ×6 IMPLANT
GOWN STRL REUS W/TWL 2XL LVL3 (GOWN DISPOSABLE) ×6 IMPLANT
GOWN STRL REUS W/TWL LRG LVL3 (GOWN DISPOSABLE) ×12
GOWN STRL REUS W/TWL XL LVL3 (GOWN DISPOSABLE) ×12
GUIDEWIRE NITINOL BEVEL TIP (WIRE) ×12 IMPLANT
KIT BASIN OR (CUSTOM PROCEDURE TRAY) ×8 IMPLANT
KIT DILATOR XLIF 5 (KITS) ×1 IMPLANT
KIT INFUSE X SMALL 1.4CC (Orthopedic Implant) ×2 IMPLANT
KIT POSITION SURG JACKSON T1 (MISCELLANEOUS) ×4 IMPLANT
KIT SURGICAL ACCESS MAXCESS 4 (KITS) ×2 IMPLANT
KIT TURNOVER KIT B (KITS) ×8 IMPLANT
KIT XLIF (KITS) ×1
MARKER SKIN DUAL TIP RULER LAB (MISCELLANEOUS) ×4 IMPLANT
MODULE NVM5 NEXT GEN EMG (NEEDLE) ×2 IMPLANT
MODULUS XLW 12X22X60MM 10 (Spine Construct) ×2 IMPLANT
NDL HYPO 21X1.5 SAFETY (NEEDLE) IMPLANT
NDL HYPO 25X1 1.5 SAFETY (NEEDLE) ×4 IMPLANT
NDL I PASS (NEEDLE) IMPLANT
NEEDLE HYPO 21X1.5 SAFETY (NEEDLE) ×4 IMPLANT
NEEDLE HYPO 25X1 1.5 SAFETY (NEEDLE) ×8 IMPLANT
NEEDLE I PASS (NEEDLE) ×4 IMPLANT
NS IRRIG 1000ML POUR BTL (IV SOLUTION) ×8 IMPLANT
OIL CARTRIDGE MAESTRO DRILL (MISCELLANEOUS)
PACK LAMINECTOMY NEURO (CUSTOM PROCEDURE TRAY) ×6 IMPLANT
PAD ARMBOARD 7.5X6 YLW CONV (MISCELLANEOUS) ×12 IMPLANT
PATTIES SURGICAL .5 X.5 (GAUZE/BANDAGES/DRESSINGS) IMPLANT
PATTIES SURGICAL .5 X1 (DISPOSABLE) IMPLANT
PATTIES SURGICAL 1X1 (DISPOSABLE) IMPLANT
PUTTY BONE ATTRAX 10CC STRIP (Putty) ×2 IMPLANT
ROD RELINE MAS LORD 5.5X75MM (Rod) ×4 IMPLANT
SCREW LOCK RELINE 5.5 TULIP (Screw) ×20 IMPLANT
SCREW MAS RELINE 6.5X45 POLY (Screw) ×2 IMPLANT
SCREW MAS RELINE 6.5X50 POLY (Screw) ×10 IMPLANT
SPONGE LAP 4X18 RFD (DISPOSABLE) IMPLANT
STAPLER SKIN PROX WIDE 3.9 (STAPLE) ×4 IMPLANT
SURGIFLO W/THROMBIN 8M KIT (HEMOSTASIS) ×2 IMPLANT
SUT VIC AB 1 CT1 18XBRD ANBCTR (SUTURE) ×6 IMPLANT
SUT VIC AB 1 CT1 8-18 (SUTURE) ×16
SUT VIC AB 2-0 CT1 18 (SUTURE) ×14 IMPLANT
SUT VIC AB 3-0 SH 8-18 (SUTURE) ×14 IMPLANT
SYR INSULIN 1ML 31GX6 SAFETY (SYRINGE) IMPLANT
SYRINGE 20CC LL (MISCELLANEOUS) ×2 IMPLANT
TOWEL GREEN STERILE (TOWEL DISPOSABLE) ×4 IMPLANT
TOWEL GREEN STERILE FF (TOWEL DISPOSABLE) ×4 IMPLANT
TRAY FOLEY MTR SLVR 16FR STAT (SET/KITS/TRAYS/PACK) ×6 IMPLANT
WATER STERILE IRR 1000ML POUR (IV SOLUTION) ×6 IMPLANT

## 2018-05-06 NOTE — Evaluation (Signed)
Occupational Therapy Evaluation Patient Details Name: Daniel Johns MRN: [id number] DOB: [birth year] Today's Date: 05/06/2018    History of Present Illness Pt is a 67 y/o male s/p L3-5 ALIF. PMH includes HTN, bilat TKA, L shoulder surgery, and cervical surgery.    Clinical Impression   PTA, pt was independent with ADL and functional mobility and enjoyed golfing. He is currently limited by lower back pain and decreased dynamic balance. He currently requires min guard assist for toilet and walk-in shower transfers and max assist for LB ADL. Pt able to state 3/3 back precautions today and OT provided education concerning compensatory strategies to adhere to these during ADL. Educated pt and wife verbally concerning use of AE for LB ADL. Pt would benefit from continued OT services while admitted to improve independence and safety with ADL and functional mobility. Do not anticipate need for OT follow-up post-acute D/C. OT will continue to follow while admitted.     Follow Up Recommendations  No OT follow up;Supervision/Assistance - 24 hour(initial)    Equipment Recommendations  None recommended by OT    Recommendations for Other Services       Precautions / Restrictions Precautions Precautions: Back Precaution Booklet Issued: Yes (comment) Precaution Comments: Pt able to state 3/3 back precautions Restrictions Weight Bearing Restrictions: No      Mobility Bed Mobility Overal bed mobility: Needs Assistance Bed Mobility: Rolling;Sidelying to Sit Rolling: Supervision Sidelying to sit: Min guard       General bed mobility comments: Cues for log roll technique.   Transfers Overall transfer level: Needs assistance Equipment used: None Transfers: Sit to/from Stand Sit to Stand: Min guard;Supervision         General transfer comment: Hands on guarding assist to power up to standing safely.     Balance Overall balance assessment: Needs assistance Sitting-balance support: No  upper extremity supported;Feet supported Sitting balance-Leahy Scale: Good     Standing balance support: During functional activity;Single extremity supported;No upper extremity supported Standing balance-Leahy Scale: Fair Standing balance comment: Able to ambulate without UE support with min guard assist.                            ADL either performed or assessed with clinical judgement   ADL Overall ADL's : Needs assistance/impaired Eating/Feeding: Set up;Sitting   Grooming: Min guard;Standing   Upper Body Bathing: Set up;Sitting   Lower Body Bathing: Sit to/from stand;Maximal assistance   Upper Body Dressing : Sitting;Supervision/safety Upper Body Dressing Details (indicate cue type and reason): including brace Lower Body Dressing: Sit to/from stand;Maximal assistance Lower Body Dressing Details (indicate cue type and reason): unable to complete figure 4 method due to previous knee surgeries Toilet Transfer: Min guard;Ambulation;Regular Teacher, adult education Details (indicate cue type and reason): guarding assist for safety Toileting- Clothing Manipulation and Hygiene: Min guard;Sit to/from stand   Tub/ Shower Transfer: Min guard;Grab bars;Ambulation Tub/Shower Transfer Details (indicate cue type and reason): pt reports he does not have a grab bar but only has shower door Functional mobility during ADLs: Min guard General ADL Comments: Pt reports that he may be more comfortable using RW. Pt and wife educated concerning use of reacher for LB dressing.      Vision Patient Visual Report: No change from baseline Vision Assessment?: No apparent visual deficits     Perception     Praxis      Pertinent Vitals/Pain Pain Assessment: Faces Faces Pain Scale: Hurts whole  lot Pain Location: back  Pain Descriptors / Indicators: Aching;Operative site guarding Pain Intervention(s): Limited activity within patient's tolerance;Monitored during session;Repositioned      Hand Dominance     Extremity/Trunk Assessment Upper Extremity Assessment Upper Extremity Assessment: Overall WFL for tasks assessed   Lower Extremity Assessment Lower Extremity Assessment: Generalized weakness RLE Deficits / Details: Reports pain into thigh LLE Deficits / Details: Reports pain into thigh   Cervical / Trunk Assessment Cervical / Trunk Assessment: Other exceptions Cervical / Trunk Exceptions: s/p ALIF    Communication Communication Communication: No difficulties   Cognition Arousal/Alertness: Awake/alert Behavior During Therapy: WFL for tasks assessed/performed Overall Cognitive Status: Within Functional Limits for tasks assessed                                     General Comments  Pt's wife present during session. Discussed caring for dog and recommended pt not apply leash due to need to twist seated on couch with dog.     Exercises     Shoulder Instructions      Home Living Family/patient expects to be discharged to:: Private residence Living Arrangements: Spouse/significant other Available Help at Discharge: Family;Available 24 hours/day Type of Home: House Home Access: Stairs to enter [employer] of Steps: 3 Entrance Stairs-Rails: Right Home Layout: Two level Alternate Level Stairs-Number of Steps: 3 Alternate Level Stairs-Rails: Right Bathroom Shower/Tub: Producer, television/film/video: Standard     Home Equipment: Environmental consultant - 2 wheels;Bedside commode;Shower seat - built Designer, fashion/clothing: Reacher        Prior Functioning/Environment Level of Independence: Independent        Comments: enjoys golfing        OT Problem List: Decreased strength;Decreased range of motion;Decreased activity tolerance;Impaired balance (sitting and/or standing);Decreased safety awareness;Decreased knowledge of use of DME or AE;Decreased knowledge of precautions;Pain      OT Treatment/Interventions:  Self-care/ADL training;Therapeutic exercise;Energy conservation;DME and/or AE instruction;Therapeutic activities;Patient/family education;Balance training    OT Goals(Current goals can be found in the care plan section) Acute Rehab OT Goals Patient Stated Goal: to decrease pain  OT Goal Formulation: With patient Time For Goal Achievement: 05/20/18 Potential to Achieve Goals: Good ADL Goals Pt Will Perform Grooming: with modified independence;standing Pt Will Perform Lower Body Bathing: with modified independence;sit to/from stand;with adaptive equipment Pt Will Perform Lower Body Dressing: with modified independence;with adaptive equipment;sit to/from stand Pt Will Transfer to Toilet: with modified independence;ambulating;regular height toilet Pt Will Perform Tub/Shower Transfer: Shower transfer;ambulating;shower seat;with modified independence  OT Frequency: Min 2X/week   Barriers to D/C:            Co-evaluation              AM-PAC PT "6 Clicks" Daily Activity     Outcome Measure Help from another person eating meals?: None Help from another person taking care of personal grooming?: A Little Help from another person toileting, which includes using toliet, bedpan, or urinal?: A Lot Help from another person bathing (including washing, rinsing, drying)?: A Little Help from another person to put on and taking off regular upper body clothing?: None Help from another person to put on and taking off regular lower body clothing?: A Little 6 Click Score: 19   End of Session Equipment Utilized During Treatment: Back brace Nurse Communication: Mobility status  Activity Tolerance: Patient tolerated treatment well Patient left: in bed;with call bell/phone  within reach  OT Visit Diagnosis: Other abnormalities of gait and mobility (R26.89);Pain Pain - part of body: (back)                Time: 1610-9604 OT Time Calculation (min): 16 min Charges:  OT General Charges $OT Visit: 1  Visit OT Evaluation $OT Eval Low Complexity: 1 Low  Derrell Lolling, OTR/L Acute Rehabilitation Services Pager [phone] Office [phone]   [facility] A Maxwel Meadowcroft 05/06/2018, 5:06 PM

## 2018-05-06 NOTE — H&P (Signed)
Patient had lumbar MRI 12/02/2017, which shows worsening of foraminal stenosis at L 34, and L 45 levels and resolution of prior disc bulge at L 5 S 1.

## 2018-05-06 NOTE — Anesthesia Postprocedure Evaluation (Signed)
Anesthesia Post Note  Patient: Daniel Johns  Procedure(s) Performed: Right Lumbar Three - Four Lumbar Four - Five Anterolateral lumbar interbody decompression fusion with percutaneous pedicle screws (Right Spine Lumbar) LUMBAR PERCUTANEOUS PEDICLE SCREW TWO LEVEL (N/A Spine Lumbar)     Patient location during evaluation: PACU Anesthesia Type: General Level of consciousness: awake Pain management: pain level controlled Vital Signs Assessment: post-procedure vital signs reviewed and stable Respiratory status: spontaneous breathing Cardiovascular status: stable Postop Assessment: no apparent nausea or vomiting Anesthetic complications: no    Last Vitals:  Vitals:   05/06/18 1332 05/06/18 1634  BP: 135/89 (!) 143/97  Pulse: 97 96  Resp: 19 16  Temp:  36.6 C  SpO2: 96% 93%    Last Pain:  Vitals:   05/06/18 1634  TempSrc: Oral  PainSc:                  Chiann Goffredo

## 2018-05-06 NOTE — Transfer of Care (Signed)
Immediate Anesthesia Transfer of Care Note  Patient: Daniel Johns  Procedure(s) Performed: Right Lumbar Three - Four Lumbar Four - Five Anterolateral lumbar interbody decompression fusion with percutaneous pedicle screws (Right Spine Lumbar) LUMBAR PERCUTANEOUS PEDICLE SCREW TWO LEVEL (N/A Spine Lumbar)  Patient Location: PACU  Anesthesia Type:General  Level of Consciousness: awake, alert , oriented and patient cooperative  Airway & Oxygen Therapy: Patient Spontanous Breathing and Patient connected to nasal cannula oxygen  Post-op Assessment: Report given to RN, Post -op Vital signs reviewed and stable and Patient moving all extremities X 4  Post vital signs: Reviewed and stable  Last Vitals:  Vitals Value Taken Time  BP 121/82 05/06/2018 11:50 AM  Temp    Pulse 91 05/06/2018 11:52 AM  Resp 13 05/06/2018 11:52 AM  SpO2 99 % 05/06/2018 11:52 AM  Vitals shown include unvalidated device data.  Last Pain:  Vitals:   05/06/18 0623  TempSrc: Oral  PainSc:          Complications: No apparent anesthesia complications

## 2018-05-06 NOTE — Anesthesia Procedure Notes (Signed)
Procedure Name: Intubation Date/Time: 05/06/2018 7:45 AM Performed by: Carney Living, CRNA Pre-anesthesia Checklist: Patient identified, Suction available, Emergency Drugs available, Patient being monitored and Timeout performed Patient Re-evaluated:Patient Re-evaluated prior to induction Preoxygenation: Pre-oxygenation with 100% oxygen Induction Type: IV induction Ventilation: Mask ventilation without difficulty and Oral airway inserted - appropriate to patient size Laryngoscope Size: Mac and 4 Grade View: Grade II Tube type: Oral Tube size: 7.5 mm Number of attempts: 1 Airway Equipment and Method: Stylet Placement Confirmation: ETT inserted through vocal cords under direct vision,  positive ETCO2 and breath sounds checked- equal and bilateral Secured at: 23 cm Tube secured with: Tape Dental Injury: Teeth and Oropharynx as per pre-operative assessment

## 2018-05-06 NOTE — Anesthesia Preprocedure Evaluation (Addendum)
Anesthesia Evaluation  Patient identified by MRN, date of birth, ID band Patient awake    Reviewed: Allergy & Precautions, NPO status , Patient's Chart, lab work & pertinent test results  History of Anesthesia Complications (+) PONV  Airway Mallampati: II       Dental  (+) Teeth Intact, Dental Advisory Given   Pulmonary sleep apnea ,    breath sounds clear to auscultation       Cardiovascular hypertension, Pt. on medications  Rhythm:Regular Rate:Normal     Neuro/Psych    GI/Hepatic negative GI ROS, Neg liver ROS,   Endo/Other  negative endocrine ROS  Renal/GU negative Renal ROS     Musculoskeletal  (+) Arthritis ,   Abdominal   Peds  Hematology   Anesthesia Other Findings   Reproductive/Obstetrics                            Anesthesia Physical Anesthesia Plan  ASA: III  Anesthesia Plan: General   Post-op Pain Management:    Induction: Intravenous  PONV Risk Score and Plan: Treatment may vary due to age or medical condition, Ondansetron, Dexamethasone and Midazolam  Airway Management Planned: Oral ETT  Additional Equipment:   Intra-op Plan:   Post-operative Plan: Possible Post-op intubation/ventilation  Informed Consent: I have reviewed the patients History and Physical, chart, labs and discussed the procedure including the risks, benefits and alternatives for the proposed anesthesia with the patient or authorized representative who has indicated his/her understanding and acceptance.   Dental advisory given  Plan Discussed with: CRNA, Anesthesiologist and Surgeon  Anesthesia Plan Comments:        Anesthesia Quick Evaluation

## 2018-05-06 NOTE — Progress Notes (Signed)
OT Cancellation Note  Patient Details Name: LD EPLIN MRN: [id number] DOB: 04-Sep-1950   Cancelled Treatment:    Reason Eval/Treat Not Completed: Patient at procedure or test/ unavailable. Pt with IV team at this time. Will check back as able to initiate evaluation.   Zakiyyah Savannah Marcy Panning, OTR/L Acute Rehabilitation Services Pager [phone] Office [phone]   Doristine Section 05/06/2018, 3:56 PM

## 2018-05-06 NOTE — Brief Op Note (Signed)
05/06/2018  11:34 AM  PATIENT:  Daniel Johns  67 y.o. male  PRE-OPERATIVE DIAGNOSIS:  Spinal stenosis, Lumbar region without neurogenic claudication, lumbar scoliosis, spondylolisthesis, lumbago, radiculopathy L 34 and L 45 levels  POST-OPERATIVE DIAGNOSIS: Spinal stenosis, Lumbar region without neurogenic claudication, lumbar scoliosis, spondylolisthesis, lumbago, radiculopathy L 34 and L 45 levels  PROCEDURE:  Procedure(s) with comments: Right Lumbar Three - Four Lumbar Four - Five Anterolateral lumbar interbody decompression fusion with percutaneous pedicle screws (Right) - Right Lumbar Three - Four Lumbar Four - Five Anterolateral lumbar interbody decompression fusion with percutaneous pedicle screws LUMBAR PERCUTANEOUS PEDICLE SCREW TWO LEVEL (N/A) - LUMBAR PERCUTANEOUS PEDICLE SCREW TWO LEVEL  SURGEON:  Surgeon(s) and Role:    Maeola Harman, MD - Primary    * Ostergard, Clovis Pu, MD - Assisting  PHYSICIAN ASSISTANT:   ASSISTANTS: Poteat, RN   ANESTHESIA:   general  EBL:  100 mL   BLOOD ADMINISTERED:none  DRAINS: none   LOCAL MEDICATIONS USED:  MARCAINE    and LIDOCAINE   SPECIMEN:  No Specimen  DISPOSITION OF SPECIMEN:  N/A  COUNTS:  YES  TOURNIQUET:  * No tourniquets in log *  DICTATION: DICTATION: Patient is a 67 year old man with spondylosis, foraminal stenosis and scoliosis of the lumbar spine with lumbar radiculopathy and lumbago. It was elected to take him to surgery for anterolateral decompression and posterior pedicle screw fixation.    Procedure: Patient was brought to the operating room and placed in a left lateral decubitus position on the operative table and using orthogonally projected C-arm fluoroscopy the patient was placed so that the L34 and L 45 levels were visualized in AP and lateral plane. The patient was then taped into position. The table was flexed so as to expose the L 45 level as the patient has a high iliac crest. Skin was marked along with a  posterior finger dissection incision. His flank was then prepped and draped in usual sterile fashion and incisions were made sequentially at L 45  level. Posterior finger dissection was made to enter the retroperitoneal space and then subsequently the probe was inserted into the psoas muscle from the right side initially at the L 45 level. After mapping the neural elements were able to dock the probe per the midpoint of this vertebral level and without indications electrically of too close proximity to the neural tissues. Subsequently the self-retaining tractor was.after sequential dilators were utilized the shim was employed and the interspace was cleared of psoas muscle and then incised. A thorough discectomy was performed. Instruments were used to clear the interspace of disc material. An anterior entry with posterior trajectory was performed to avoid neural elements.   After thorough discectomy was performed and this was performed using AP and lateral fluoroscopy a 12 lordotic by 60 x 22 mm implant was packed with extra small BMP and Attrax. This was tamped into position using the slides and its position was confirmed on AP and lateral fluoroscopy. Subsequently exposure was performed at the L34 level and similar dissection was performed with locking of the self-retaining retractor. At this level were able to place a 10 lordotic x 22 x 60 mm implant was packed in a similar fashion.  Hemostasis was assured the wounds were irrigated and closed with interrupted Vicryl sutures.  Sterile occlusive dressings were placed. Retractor times were:  L 45: 20 minutes;  L 34: 16 minutes.   Patient was then turned into a prone position on the operating table  on [place] table and using AP and lateral fluoroscopy throughout this portion of the procedure, pedicle screws were placed using Reline Nuvasive cannulated percutaneous screws. 2 screws were placed at L3 (6.5 x 45 right and 6.5 x 50 mm left), and two at L4 (6.5 x 50  mm),  2 at L5 (6.5 x 50 mm)  . 75 mm rods were then affixed to the screw heads do a separate stab incision and locked down on the screws on the left and 75 mm rod on the right. All connections were then torqued and the Towers were disassembled. The wounds were irrigated and then closed with 1, 2-0 and 3-0 Vicryl stitches. Sterile occlusive dressing was placed with Dermabond. Long-acting Marcaine was injected. The patient was then extubated in the operating room and taken to recovery in stable and satisfactory condition having tolerated her operation well. Counts were correct at the end of the case.  Pelvic Parameters:  Preop: LL 34 degrees  PLAN OF CARE: Admit to inpatient   PATIENT DISPOSITION:  PACU - hemodynamically stable.   Delay start of Pharmacological VTE agent (>24hrs) due to surgical blood loss or risk of bleeding: yes

## 2018-05-06 NOTE — Evaluation (Signed)
Physical Therapy Evaluation Patient Details Name: Daniel Johns MRN: [id number] DOB: June 13, 1951 Today's Date: 05/06/2018   History of Present Illness  Pt is a 67 y/o male s/p L3-5 ALIF. PMH includes HTN, bilat TKA, L shoulder surgery, and cervical surgery.   Clinical Impression  Patient is s/p above surgery resulting in the deficits listed below (see PT Problem List). Pt very guarded during ambulation. Able to perform mobility tasks with min to min guard with use of IV pole. Educated about walking program and back precautions. Patient will benefit from skilled PT to increase their independence and safety with mobility (while adhering to their precautions) to allow discharge to the venue listed below.     Follow Up Recommendations No PT follow up;Supervision for mobility/OOB    Equipment Recommendations  None recommended by PT    Recommendations for Other Services       Precautions / Restrictions Precautions Precautions: Back Precaution Booklet Issued: Yes (comment) Precaution Comments: Reviewed back precautions for pt.  Restrictions Weight Bearing Restrictions: No      Mobility  Bed Mobility Overal bed mobility: Needs Assistance Bed Mobility: Rolling;Sidelying to Sit Rolling: Min assist Sidelying to sit: Min guard       General bed mobility comments: Min A for assist with rolling to the side and min guard for safety to come to sitting. Verbal cues for log roll technique.   Transfers Overall transfer level: Needs assistance Equipment used: None Transfers: Sit to/from Stand Sit to Stand: Min assist         General transfer comment: Min A for lift assist and steadying. Verbal cues to power through LEs.   Ambulation/Gait Ambulation/Gait assistance: Min guard Gait Distance (Feet): 125 Feet Assistive device: IV Pole Gait Pattern/deviations: Step-through pattern;Decreased stride length Gait velocity: Decreased    General Gait Details: Slow, very guarded gait. Mild  unsteadiness noted and requesting to hold onto IV pole. Educated about use of RW to increase stability at home. Educated about generalized walking program to perform at home.   Stairs            Wheelchair Mobility    Modified Rankin (Stroke Patients Only)       Balance Overall balance assessment: Needs assistance Sitting-balance support: No upper extremity supported;Feet supported Sitting balance-Leahy Scale: Good     Standing balance support: During functional activity;Single extremity supported Standing balance-Leahy Scale: Fair Standing balance comment: Able to maintain static standing without UE support.                              Pertinent Vitals/Pain Pain Assessment: Faces Faces Pain Scale: Hurts whole lot Pain Location: back  Pain Descriptors / Indicators: Aching;Operative site guarding Pain Intervention(s): Limited activity within patient's tolerance;Monitored during session;Repositioned;RN gave pain meds during session;Patient requesting pain meds-RN notified    Home Living Family/patient expects to be discharged to:: Private residence Living Arrangements: Spouse/significant other Available Help at Discharge: Family;Available 24 hours/day Type of Home: House Home Access: Stairs to enter Entrance Stairs-Rails: Right Entrance Stairs-Number of Steps: 3 Home Layout: Two level Home Equipment: Walker - 2 wheels;Bedside commode;Shower seat - built in      Prior Function Level of Independence: Independent               Hand Dominance        Extremity/Trunk Assessment   Upper Extremity Assessment Upper Extremity Assessment: Defer to OT evaluation    Lower Extremity Assessment  Lower Extremity Assessment: Generalized weakness;RLE deficits/detail;LLE deficits/detail RLE Deficits / Details: Reports pain into thigh LLE Deficits / Details: Reports pain into thigh    Cervical / Trunk Assessment Cervical / Trunk Assessment: Other  exceptions Cervical / Trunk Exceptions: s/p ALIF   Communication   Communication: No difficulties  Cognition Arousal/Alertness: Awake/alert Behavior During Therapy: WFL for tasks assessed/performed Overall Cognitive Status: Within Functional Limits for tasks assessed                                        General Comments General comments (skin integrity, edema, etc.): Pt's wife present during session     Exercises     Assessment/Plan    PT Assessment Patient needs continued PT services  PT Problem List Decreased strength;Decreased balance;Decreased mobility;Decreased knowledge of use of DME;Decreased knowledge of precautions;Pain       PT Treatment Interventions Gait training;DME instruction;Stair training;Functional mobility training;Therapeutic exercise;Therapeutic activities;Balance training;Patient/family education    PT Goals (Current goals can be found in the Care Plan section)  Acute Rehab PT Goals Patient Stated Goal: to decrease pain  PT Goal Formulation: With patient Time For Goal Achievement: 05/20/18 Potential to Achieve Goals: Good    Frequency Min 5X/week   Barriers to discharge        Co-evaluation               AM-PAC PT "6 Clicks" Daily Activity  Outcome Measure Difficulty turning over in bed (including adjusting bedclothes, sheets and blankets)?: Unable Difficulty moving from lying on back to sitting on the side of the bed? : Unable Difficulty sitting down on and standing up from a chair with arms (e.g., wheelchair, bedside commode, etc,.)?: Unable Help needed moving to and from a bed to chair (including a wheelchair)?: A Little Help needed walking in hospital room?: A Little Help needed climbing 3-5 steps with a railing? : A Lot 6 Click Score: 11    End of Session Equipment Utilized During Treatment: Gait belt;Back brace Activity Tolerance: Patient tolerated treatment well Patient left: in bed;with call bell/phone within  reach;with family/visitor present(sitting EOB ) Nurse Communication: Mobility status;Patient requests pain meds PT Visit Diagnosis: Unsteadiness on feet (R26.81);Muscle weakness (generalized) (M62.81);Pain Pain - part of body: (back )    Time: 8280-0349 PT Time Calculation (min) (ACUTE ONLY): 14 min   Charges:   PT Evaluation $PT Eval Low Complexity: 1 Low          Gladys Damme, PT, DPT  Acute Rehabilitation Services  Pager: [phone] Office: [phone]   Lehman Prom 05/06/2018, 3:15 PM

## 2018-05-06 NOTE — Interval H&P Note (Signed)
History and Physical Interval Note:  05/06/2018 7:39 AM  Daniel Johns  has presented today for surgery, with the diagnosis of Spinal stenosis, Lumbar region without neurogenic claudication  The various methods of treatment have been discussed with the patient and family. After consideration of risks, benefits and other options for treatment, the patient has consented to  Procedure(s) with comments: Right Lumbar 3-4 Lumbar 4-5 Anterolateral lumbar interbody decompression fusion with percutaneous pedicle screws (Right) - Right Lumbar 3-4 Lumbar 4-5 Anterolateral lumbar interbody decompression fusion with percutaneous pedicle screws LUMBAR PERCUTANEOUS PEDICLE SCREW 2 LEVEL (N/A) as a surgical intervention .  The patient's history has been reviewed, patient examined, no change in status, stable for surgery.  I have reviewed the patient's chart and labs.  Questions were answered to the patient's satisfaction.     [name]

## 2018-05-06 NOTE — Op Note (Signed)
05/06/2018  11:34 AM  PATIENT:  Daniel Johns  66 y.o. male  PRE-OPERATIVE DIAGNOSIS:  Spinal stenosis, Lumbar region without neurogenic claudication, lumbar scoliosis, spondylolisthesis, lumbago, radiculopathy L 34 and L 45 levels  POST-OPERATIVE DIAGNOSIS: Spinal stenosis, Lumbar region without neurogenic claudication, lumbar scoliosis, spondylolisthesis, lumbago, radiculopathy L 34 and L 45 levels  PROCEDURE:  Procedure(s) with comments: Right Lumbar Three - Four Lumbar Four - Five Anterolateral lumbar interbody decompression fusion with percutaneous pedicle screws (Right) - Right Lumbar Three - Four Lumbar Four - Five Anterolateral lumbar interbody decompression fusion with percutaneous pedicle screws LUMBAR PERCUTANEOUS PEDICLE SCREW TWO LEVEL (N/A) - LUMBAR PERCUTANEOUS PEDICLE SCREW TWO LEVEL  SURGEON:  Surgeon(s) and Role:    * Shakeila Pfarr, MD - Primary    * Ostergard, Thomas A, MD - Assisting  PHYSICIAN ASSISTANT:   ASSISTANTS: Poteat, RN   ANESTHESIA:   general  EBL:  100 mL   BLOOD ADMINISTERED:none  DRAINS: none   LOCAL MEDICATIONS USED:  MARCAINE    and LIDOCAINE   SPECIMEN:  No Specimen  DISPOSITION OF SPECIMEN:  N/A  COUNTS:  YES  TOURNIQUET:  * No tourniquets in log *  DICTATION: DICTATION: Patient is a 66-year-old man with spondylosis, foraminal stenosis and scoliosis of the lumbar spine with lumbar radiculopathy and lumbago. It was elected to take him to surgery for anterolateral decompression and posterior pedicle screw fixation.    Procedure: Patient was brought to the operating room and placed in a left lateral decubitus position on the operative table and using orthogonally projected C-arm fluoroscopy the patient was placed so that the L34 and L 45 levels were visualized in AP and lateral plane. The patient was then taped into position. The table was flexed so as to expose the L 45 level as the patient has a high iliac crest. Skin was marked along with a  posterior finger dissection incision. His flank was then prepped and draped in usual sterile fashion and incisions were made sequentially at L 45  level. Posterior finger dissection was made to enter the retroperitoneal space and then subsequently the probe was inserted into the psoas muscle from the right side initially at the L 45 level. After mapping the neural elements were able to dock the probe per the midpoint of this vertebral level and without indications electrically of too close proximity to the neural tissues. Subsequently the self-retaining tractor was.after sequential dilators were utilized the shim was employed and the interspace was cleared of psoas muscle and then incised. A thorough discectomy was performed. Instruments were used to clear the interspace of disc material. An anterior entry with posterior trajectory was performed to avoid neural elements.   After thorough discectomy was performed and this was performed using AP and lateral fluoroscopy a 12 lordotic by 60 x 22 mm implant was packed with extra small BMP and Attrax. This was tamped into position using the slides and its position was confirmed on AP and lateral fluoroscopy. Subsequently exposure was performed at the L34 level and similar dissection was performed with locking of the self-retaining retractor. At this level were able to place a 10 lordotic x 22 x 60 mm implant was packed in a similar fashion.  Hemostasis was assured the wounds were irrigated and closed with interrupted Vicryl sutures.  Sterile occlusive dressings were placed. Retractor times were:  L 45: 20 minutes;  L 34: 16 minutes.   Patient was then turned into a prone position on the operating table   on Jackson table and using AP and lateral fluoroscopy throughout this portion of the procedure, pedicle screws were placed using Reline Nuvasive cannulated percutaneous screws. 2 screws were placed at L3 (6.5 x 45 right and 6.5 x 50 mm left), and two at L4 (6.5 x 50  mm),  2 at L5 (6.5 x 50 mm)  . 75 mm rods were then affixed to the screw heads do a separate stab incision and locked down on the screws on the left and 75 mm rod on the right. All connections were then torqued and the Towers were disassembled. The wounds were irrigated and then closed with 1, 2-0 and 3-0 Vicryl stitches. Sterile occlusive dressing was placed with Dermabond. Long-acting Marcaine was injected. The patient was then extubated in the operating room and taken to recovery in stable and satisfactory condition having tolerated her operation well. Counts were correct at the end of the case.  Pelvic Parameters:  Preop: LL 34 degrees  PLAN OF CARE: Admit to inpatient   PATIENT DISPOSITION:  PACU - hemodynamically stable.   Delay start of Pharmacological VTE agent (>24hrs) due to surgical blood loss or risk of bleeding: yes  

## 2018-05-06 NOTE — Progress Notes (Signed)
Awake, alert, conversant.  MAEW with good strength.  Doing well. 

## 2018-05-06 NOTE — H&P (Signed)
Patient ID:   [id number] Patient: Daniel Johns  Date of Birth: 03-Jul-1951 Visit Type: Office Visit   Date: 03/10/2018 11:15 AM Provider: Danae Orleans. Venetia Maxon MD   This 67 year old male presents for back pain.  HISTORY OF PRESENT ILLNESS:  1.  back pain  01/03/2017 C4-7 ACDF 09/23/2017 left TKR  Patient, 67 y/o male, returns reporting persistent right greater than left leg pain throughout the day with bilateral hip pain nightly.  He feels he is not healing as well following left total knee replacement, attributing low back and leg pain.  He wishes to discuss surgical options.  Lumbar x-rays on [facility]  06/10/14 L-spine MRI without contrast 1. Combination of congenital and acquired spinal stenosis is severe at L3-4. There is also right greater than left L3 neural foraminal stenosis. Query right L3 radiculitis. 2. Small rightward disc herniation at L5-S1, mostly affecting the descending right S1 nerve roots in the lateral recess. Query right S1 radiculitis. 3. Mild to moderate multifactorial spinal stenosis at L2-3 and L4-5 respectively. Moderate left lateral recess and left foraminal stenosis at the latter. 4. Chronic L4 vertebral body fracture.          PAST MEDICAL/SURGICAL HISTORY:   (Reviewed, updated)    Disease/disorder Onset Date Management Date Comments    Surgery, cervical spine 01/03/2017     Knee replacement 11/09/2013     Left Shoulder Surgery    Hypertension        Arthroscopy knee       Family History:  (Reviewed, updated) Relationship Family Member Name Deceased Age at Death Condition Onset Age Cause of Death      Family history of Hypertension  N     Social History:  (Reviewed, updated) Tobacco use reviewed. Preferred language is [place].   Tobacco use status: Current non-smoker. Smoking status: Never smoker.  SMOKING STATUS Type Smoking Status Usage Per Day Years Used Total Pack Years   Never smoker          MEDICATIONS: (added, continued or  stopped this visit) Started Medication Directions Instruction Stopped   colchicine 0.6 mg capsule take 1 capsule by oral route  every day     hydrochlorothiazide 25 mg tablet take 1 tablet by oral route  every day    12/18/2017 hydrocodone 5 mg-acetaminophen 325 mg tablet take 1 tablet by oral route  every 12 hours as needed for pain  03/10/2018  03/10/2018 hydrocodone 5 mg-acetaminophen 325 mg tablet take 1 tablet by oral route  every 12 hours as needed for pain     lisinopril 20 mg tablet take 1 tablet by oral route  every day       ALLERGIES: Ingredient Reaction Medication Name Comment  NO KNOWN ALLERGIES     No known allergies. Reviewed, no changes.    PHYSICAL EXAM:   Vitals Date Temp F BP Pulse Ht In Wt Lb BMI BSA Pain Score  03/10/2018  169/113 97 71 224 31.24  5/10      IMPRESSION:   Recommended right-sided approach L3-4 and L4-5 XLIF with percutaneous pedicle screws and LSO brace fitting  PLAN:  1. Nurse education 2. LSO brace fitting 3. L3-4 and L4-5 XLIF scheduled 4. Follow-up 3 weeks after XLIF surgery  Orders: Diagnostic Procedures: Assessment Procedure  M54.16 Lumbar Spine- AP/Lat  Instruction(s)/Education: Assessment Instruction  I10 Hypertension education  Z68.31 Dietary management education, guidance, and counseling  Miscellaneous: Assessment   M48.061 Aspen Lo Sag Rigid Panel Quick   Completed Orders (  this encounter) Order Details Reason Side Interpretation Result Initial Treatment Date Region  Dietary management education, guidance, and counseling Encouraged to eat a well balanced diet and follow up with primary care physician.        Hypertension education Follow up with primary care physician for elevated blood pressure.         Assessment/Plan   # Detail Type Description   1. Assessment Spinal stenosis, lumbar region without neurogenic claud (M48.061).   Plan Orders Aspen Lo Sag Rigid Panel Quick.       2. Assessment Radiculopathy,  lumbar region (M54.16).       3. Assessment Body mass index (BMI) 31.0-31.9, adult (Z68.31).   Plan Orders Today's instructions / counseling include(s) Dietary management education, guidance, and counseling. Clinical information/comments: Encouraged to eat a well balanced diet and follow up with primary care physician.       4. Assessment Essential (primary) hypertension (I10).         Pain Management Plan Pain Scale: 5/10. Method: Numeric Pain Intensity Scale. Location: back. Onset: 07/04/2014. Duration: varies. Quality: discomforting. Pain management follow-up plan of care: Patient taking medications as prescribed.Marland Kitchen     MEDICATIONS PRESCRIBED TODAY    Rx Quantity Refills  HYDROCODONE-ACETAMINOPHEN 5 mg-325 mg  60 0            Provider:  Danae Orleans. Venetia Maxon MD  03/10/2018 05:30 PM Dictation edited by: Briscoe Burns    CC Providers: Wannetta Sender [address] [place],  [place]  [address]-   Georgena Spurling  [facility] [address] [place], [place] [address]-               Electronically signed by Danae Orleans Venetia Maxon MD on 03/11/2018 05:32 PM

## 2018-05-07 ENCOUNTER — Encounter (HOSPITAL_COMMUNITY): Payer: Self-pay | Admitting: Neurosurgery

## 2018-05-07 MED ORDER — OXYCODONE HCL 5 MG PO TABS: 5.0000 mg | ORAL_TABLET | ORAL | 0 refills | Status: AC | PRN

## 2018-05-07 MED ORDER — METHOCARBAMOL 500 MG PO TABS: 500.0000 mg | ORAL_TABLET | Freq: Four times a day (QID) | ORAL | 1 refills | Status: DC | PRN

## 2018-05-07 NOTE — Progress Notes (Signed)
Occupational Therapy Treatment and Discharge Patient Details Name: Daniel Johns MRN: [id number] DOB: 08-28-1951 Today's Date: 05/07/2018    History of present illness Pt is a 67 y/o male s/p L3-5 ALIF. PMH includes HTN, bilat TKA, L shoulder surgery, and cervical surgery.    OT comments  Pt demonstrating excellent progress and has met all OT goals this session. He is able complete LB dressing tasks with AE, walk-in shower simulated transfers, and toilet transfers with modified independence adhering to back precautions well. Pt able to state all precautions and educated concerning brace wear schedule as he was not aware that he needed to wear while sitting up on the EOB. Pt verbalizes and demonstrates understanding. He is planning to return home with support from his wife today. No further acute OT needs identified. OT will sign off.   Follow Up Recommendations  No OT follow up;Supervision/Assistance - 24 hour(initial)    Equipment Recommendations  None recommended by OT    Recommendations for Other Services      Precautions / Restrictions Precautions Precautions: Back Precaution Booklet Issued: Yes (comment) Precaution Comments: Pt able to state 3/3 back precautions Restrictions Weight Bearing Restrictions: No       Mobility Bed Mobility               General bed mobility comments: OOB in recliner on my arrival.   Transfers Overall transfer level: Modified independent Equipment used: None                  Balance Overall balance assessment: Needs assistance Sitting-balance support: No upper extremity supported;Feet supported Sitting balance-Leahy Scale: Good     Standing balance support: During functional activity;No upper extremity supported Standing balance-Leahy Scale: Good                             ADL either performed or assessed with clinical judgement   ADL Overall ADL's : Modified independent                 Upper Body  Dressing : Modified independent   Lower Body Dressing: Modified independent;With adaptive equipment Lower Body Dressing Details (indicate cue type and reason): Using reacher well to improve independence Toilet Transfer: Modified Independent       Tub/ Shower Transfer: Walk-in shower;Modified independent     General ADL Comments: Able to complete LB dressing, UB dressing, toilet transfers, and walk-in shower transfers with modified independence today. Had already brushed his teeth with 2-cup method on my arrival.      Vision   Vision Assessment?: No apparent visual deficits   Perception     Praxis      Cognition Arousal/Alertness: Awake/alert Behavior During Therapy: WFL for tasks assessed/performed Overall Cognitive Status: Within Functional Limits for tasks assessed                                          Exercises     Shoulder Instructions       General Comments Reinforced brace wear education as pt unaware that he should wear sitting up.     Pertinent Vitals/ Pain       Pain Assessment: Faces Faces Pain Scale: Hurts a little bit Pain Location: back  Pain Descriptors / Indicators: Aching;Operative site guarding Pain Intervention(s): Monitored during session  Home Living  Prior Functioning/Environment              Frequency  Min 2X/week        Progress Toward Goals  OT Goals(current goals can now be found in the care plan section)  Progress towards OT goals: Goals met/education completed, patient discharged from OT  Acute Rehab OT Goals Patient Stated Goal: to decrease pain  OT Goal Formulation: With patient Time For Goal Achievement: 05/20/18 Potential to Achieve Goals: Good  Plan Discharge plan remains appropriate    Co-evaluation                 AM-PAC PT "6 Clicks" Daily Activity     Outcome Measure   Help from another person eating meals?: None Help  from another person taking care of personal grooming?: None Help from another person toileting, which includes using toliet, bedpan, or urinal?: None Help from another person bathing (including washing, rinsing, drying)?: None Help from another person to put on and taking off regular upper body clothing?: None Help from another person to put on and taking off regular lower body clothing?: None 6 Click Score: 24    End of Session Equipment Utilized During Treatment: Back brace  OT Visit Diagnosis: Other abnormalities of gait and mobility (R26.89);Pain Pain - part of body: (back)   Activity Tolerance Patient tolerated treatment well   Patient Left in bed;with call bell/phone within reach   Nurse Communication Mobility status        Time: 1586-8257 OT Time Calculation (min): 12 min  Charges: OT General Charges $OT Visit: 1 Visit OT Treatments $Self Care/Home Management : 8-22 mins  Belva Agee, OTR/L [place] Pager [phone] Office Daniel Johns 05/07/2018, 8:42 AM

## 2018-05-07 NOTE — Discharge Summary (Signed)
Physician Discharge Summary  Patient ID: Daniel Johns MRN: [id number] DOB/AGE: 67-Sep-1952 67 y.o.  Admit date: 05/06/2018 Discharge date: 05/07/2018  Admission Diagnoses: Spinal stenosis, Lumbar region without neurogenic claudication, lumbar scoliosis, spondylolisthesis, lumbago, radiculopathy L 34 and L 45 levels    Discharge Diagnoses: Spinal stenosis, Lumbar region without neurogenic claudication, lumbar scoliosis, spondylolisthesis, lumbago, radiculopathy L 34 and L 45 levels s/p Right Lumbar Three - Four Lumbar Four - Five Anterolateral lumbar interbody decompression fusion with percutaneous pedicle screws (Right) - Right Lumbar Three - Four Lumbar Four - Five Anterolateral lumbar interbody decompression fusion with percutaneous pedicle screws LUMBAR PERCUTANEOUS PEDICLE SCREW TWO LEVEL (N/A) - LUMBAR PERCUTANEOUS PEDICLE SCREW TWO LEVEL     Active Problems:   Lumbar scoliosis   Discharged Condition: good  Hospital Course: Daniel Johns was admitted for surgery with dx stenosis, scoliosis, and radiculopathy. Following uncomplicated XLIF L3-4, L4-5 with percutaneous pedicle screws, he recovered nicely and transferred to [facility] for nursing care and therapies. He is mobilizing well.  Consults: None  Significant Diagnostic Studies: radiology: X-Ray: intra-op  Treatments: surgery: Right Lumbar Three - Four Lumbar Four - Five Anterolateral lumbar interbody decompression fusion with percutaneous pedicle screws (Right) - Right Lumbar Three - Four Lumbar Four - Five Anterolateral lumbar interbody decompression fusion with percutaneous pedicle screws LUMBAR PERCUTANEOUS PEDICLE SCREW TWO LEVEL (N/A) - LUMBAR PERCUTANEOUS PEDICLE SCREW TWO LEVEL    Discharge Exam: Blood pressure 114/88, pulse 80, temperature 98.4 F (36.9 C), temperature source Oral, resp. rate 18, height 5\' 11"  (1.803 m), weight 101.5 kg, SpO2 97 %. Alert, conversant. Reports no pain at present, noting  occasional right thigh discomfort. Full strength BLE. Incisions right side and lumbar without erythema, swelling, or drainage beneath honeycomb and Dermabond. Ambulating without difficulty. Voiding without difficulty. No BM yet, but no report of discomfort. Pt plans to use Senokot-S as needed at home.     Disposition: Discharge disposition: 01-Home or Self Care   Office f/u in 3-4 weeks with x-rays. Rx's for Oxy IR 10mg  and Robaxin 500mg  for prn use at home will be sent to his pharmacy from office. He verbalizes understanding of d/c instructions.           Allergies as of 05/07/2018      Reactions   Zanaflex [tizanidine Hcl]       Medication List    STOP taking these medications   ibuprofen 200 MG tablet Commonly known as:  ADVIL,MOTRIN     TAKE these medications   aspirin EC 81 MG tablet Take 81 mg by mouth daily.   aspirin 325 MG EC tablet Take 1 tablet (325 mg total) by mouth 2 (two) times daily.   cetirizine 10 MG tablet Commonly known as:  ZYRTEC Take 10 mg by mouth daily.   lisinopril-hydrochlorothiazide 20-12.5 MG tablet Commonly known as:  PRINZIDE,ZESTORETIC Take 1 tablet by mouth daily.   methocarbamol 500 MG tablet Commonly known as:  ROBAXIN Take 1 tablet (500 mg total) by mouth every 6 (six) hours as needed for muscle spasms.   oxyCODONE 5 MG immediate release tablet Commonly known as:  Oxy IR/ROXICODONE Take 1-2 tablets (5-10 mg total) by mouth every 3 (three) hours as needed for severe pain ((score 7 to 10)).        Signed: Dorian Heckle, MD 05/07/2018, 8:08 AM

## 2018-05-07 NOTE — Progress Notes (Addendum)
Alert, conversant. Reports no pain at present, noting occasional right thigh discomfort. Full strength BLE. Incisions right side and lumbar without erythema, swelling, or drainage beneath honeycomb and Dermabond. Ambulating without difficulty. Voiding without difficulty. No BM yet, but no report of discomfort. Pt plans to use Senokot-S as needed at home.   Per DrStern, d/c to home. Office f/u in 3-4 weeks with x-rays. Rx's for Oxy IR 10mg  and Robaxin 500mg  for prn use at home will be sent to his pharmacy from office. He verbalizes understanding of d/c instructions.  Patient is doing well.  Discharge home.

## 2018-05-07 NOTE — Discharge Instructions (Signed)

## 2018-05-07 NOTE — Progress Notes (Signed)
Patient alert and oriented, mae's well, voiding adequate amount of urine, swallowing without difficulty, no c/o pain at time of discharge. Patient discharged home with family. Script and discharged instructions given to patient. Patient and family stated understanding of instructions given. Patient has an appointment with Dr.Stern    

## 2018-05-07 NOTE — Progress Notes (Signed)
Physical Therapy Treatment and Discharge Patient Details Name: Daniel Johns MRN: [id number] DOB: 1951-04-03 Today's Date: 05/07/2018    History of Present Illness Pt is a 67 y/o male s/p L3-5 ALIF. PMH includes HTN, bilat TKA, L shoulder surgery, and cervical surgery.     PT Comments    Pt met all goals at time of visit, negotiating stairs with supervision with use of rail for safety. Pt instructed on generalized walking program, car transfers, and overall safety with mobility. Not recommending an AD at d/c as pt demonstrated improved steadiness at today's session. All education completed and pt has no further questions. Pt does not require further services from physical therapy at this time. Thank you for this referral. Physical Therapy is signing off at this time, if need to change please reconsult.    Follow Up Recommendations  No PT follow up;Supervision for mobility/OOB     Equipment Recommendations  None recommended by PT    Recommendations for Other Services       Precautions / Restrictions Precautions Precautions: Back Precaution Comments: Pt able to state 3/3 back precautions Restrictions Weight Bearing Restrictions: No    Mobility  Bed Mobility               General bed mobility comments: Pt seated EOB upon arrival. Verbally reviewed log roll sequence.   Transfers Overall transfer level: Modified independent Equipment used: None Transfers: Sit to/from Stand Sit to Stand: Modified independent (Device/Increase time)         General transfer comment: Pt demonstrated proper hand placement and able to power to stand without assist at todays session. Mod I for increased time noted secondary to pain.  Ambulation/Gait Ambulation/Gait assistance: Supervision;Modified independent (Device/Increase time) Gait Distance (Feet): 350 Feet Assistive device: None Gait Pattern/deviations: Step-through pattern Gait velocity: Decreased  Gait velocity interpretation:  <1.31 ft/sec, indicative of household ambulator General Gait Details: Pt supervision initially for safety progressing to mod I. Pt demonstrating improved steadiness and ambulating without use of assistive device.   Stairs Stairs: Yes Stairs assistance: Min guard;Supervision Stair Management: One rail Right;Forwards;Step to pattern Number of Stairs: 3 General stair comments: Pt ascended forwards with stronger LE leading and descended forwards with weaker LE leading. Utilized rail with min cues for safety and technique. Pt reported he felt comfortable negotiating 3 steps and did not need to practice a second time. Able to live on first level of home.    Wheelchair Mobility    Modified Rankin (Stroke Patients Only)       Balance Overall balance assessment: Mild deficits observed, not formally tested                                          Cognition Arousal/Alertness: Awake/alert Behavior During Therapy: WFL for tasks assessed/performed Overall Cognitive Status: Within Functional Limits for tasks assessed                                        Exercises      General Comments General comments (skin integrity, edema, etc.): Donned brace seated EOB. Educated pt on brace adjustments when doffing       Pertinent Vitals/Pain Pain Assessment: Faces Faces Pain Scale: Hurts a little bit Pain Location: back  Pain Descriptors / Indicators: Sore;Grimacing;Discomfort Pain Intervention(s): Limited  activity within patient's tolerance;Monitored during session;Repositioned    Home Living                      Prior Function            PT Goals (current goals can now be found in the care plan section) Acute Rehab PT Goals Patient Stated Goal: to decrease pain  PT Goal Formulation: With patient Time For Goal Achievement: 05/20/18 Potential to Achieve Goals: Good Progress towards PT goals: Goals met/education completed, patient discharged  from PT    Frequency    Min 5X/week      PT Plan Discharge plan needs to be updated    Co-evaluation              AM-PAC PT "6 Clicks" Daily Activity  Outcome Measure  Difficulty turning over in bed (including adjusting bedclothes, sheets and blankets)?: A Little Difficulty moving from lying on back to sitting on the side of the bed? : A Little Difficulty sitting down on and standing up from a chair with arms (e.g., wheelchair, bedside commode, etc,.)?: A Little Help needed moving to and from a bed to chair (including a wheelchair)?: None Help needed walking in hospital room?: A Little Help needed climbing 3-5 steps with a railing? : A Little 6 Click Score: 19    End of Session Equipment Utilized During Treatment: Gait belt;Back brace Activity Tolerance: Patient tolerated treatment well Patient left: in chair;with call bell/phone within reach Nurse Communication: Mobility status PT Visit Diagnosis: Other abnormalities of gait and mobility (R26.89);Pain Pain - part of body: (incision on back)     Time: 4536-4680 PT Time Calculation (min) (ACUTE ONLY): 15 min  Charges:  $Gait Training: 8-22 mins                     Einar Crow, [place]  Student Physical Therapist Acute Rehab [phone]    Einar Crow 05/07/2018, 3:18 PM

## 2018-05-09 MED FILL — Sodium Chloride IV Soln 0.9%: INTRAVENOUS | Qty: 1000 | Status: AC

## 2018-05-09 MED FILL — Heparin Sodium (Porcine) Inj 1000 Unit/ML: INTRAMUSCULAR | Qty: 30 | Status: AC

## 2019-04-09 ENCOUNTER — Other Ambulatory Visit: Payer: Self-pay | Admitting: Neurosurgery

## 2019-05-14 NOTE — Progress Notes (Signed)
[facility] [phone] [facility] [place], [place] - [address] AT [facility] [address] [place] [id number] Phone: [phone] Fax: [phone]      Your procedure is scheduled on Tuesday 05/19/2019.  [facility] Entr[facility] "A" at 05:30 A.M., and check in at the Admitting office.  Call this number if you have problems the morning of surgery:  [phone]  Call [phone] if you have any questions prior to your surgery date Monday-Friday 8am-4pm    Remember:  Do not eat or drink after midnight the night before your surgery     Take these medicines the morning of surgery with A SIP OF WATER: Certirizine (Zyrtec) Oxycodone (Oxy IR/Roxicodone) - if needed  Follow your surgeon's instructions on when to stop Aspirin.  If no instructions were given by your surgeon then you will need to call the office to get those instructions.    7 days prior to surgery STOP taking any Aspirin (unless otherwise instructed by your surgeon), Aleve, Naproxen, Ibuprofen, Motrin, Advil, Goody's, BC's, all herbal medications, fish oil, and all vitamins.    The Morning of Surgery  Do not wear jewelry.  Do not wear lotions, powders, or colognes, or deodorant  Men may shave face and neck.  Do not bring valuables to the hospital.  [facility] is not responsible for any belongings or valuables.  IF you are a smoker, DO NOT Smoke 24 hours prior to surgery  IF you wear a CPAP at night please bring your mask, tubing, and machine the morning of surgery   Remember that you must have someone to transport you home after your surgery, and remain with you for 24 hours if you are discharged the same day.   Contacts, eyeglasses, hearing aids, dentures or bridgework may not be worn into surgery.    Leave your suitcase in the car.  After surgery it may be brought to your room.  For patients admitted to the hospital, discharge time will be determined by your treatment  team.  Patients discharged the day of surgery will not be allowed to drive home.    Special instructions:   [place]- Preparing For Surgery  Before surgery, you can play an important role. Because skin is not sterile, your skin needs to be as free of germs as possible. You can reduce the number of germs on your skin by washing with CHG (chlorahexidine gluconate) Soap before surgery.  CHG is an antiseptic cleaner which kills germs and bonds with the skin to continue killing germs even after washing.    Oral Hygiene is also important to reduce your risk of infection.  Remember - BRUSH YOUR TEETH THE MORNING OF SURGERY WITH YOUR REGULAR TOOTHPASTE  Please do not use if you have an allergy to CHG or antibacterial soaps. If your skin becomes reddened/irritated stop using the CHG.  Do not shave (including legs and underarms) for at least 48 hours prior to first CHG shower. It is OK to shave your face.  Please follow these instructions carefully.   1. Shower the NIGHT BEFORE SURGERY and the MORNING OF SURGERY with CHG Soap.   2. If you chose to wash your hair, wash your hair first as usual with your normal shampoo.  3. After you shampoo, rinse your hair and body thoroughly to remove the shampoo.  4. Use CHG as you would any other liquid soap. You can apply CHG  directly to the skin and wash gently with a scrungie or a clean washcloth.   5. Apply the CHG Soap to your body ONLY FROM THE NECK DOWN.  Do not use on open wounds or open sores. Avoid contact with your eyes, ears, mouth and genitals (private parts). Wash Face and genitals (private parts)  with your normal soap.   6. Wash thoroughly, paying special attention to the area where your surgery will be performed.  7. Thoroughly rinse your body with warm water from the neck down.  8. DO NOT shower/wash with your normal soap after using and rinsing off the CHG Soap.  9. Pat yourself dry with a CLEAN TOWEL.  10. Wear CLEAN PAJAMAS to bed  the night before surgery, wear comfortable clothes the morning of surgery  11. Place CLEAN SHEETS on your bed the night of your first shower and DO NOT SLEEP WITH PETS.    Day of Surgery:  Please shower the morning of surgery with the CHG soap Do not apply any deodorants/lotions.  Please wear clean clothes to the hospital/surgery center.   Remember to brush your teeth WITH YOUR REGULAR TOOTHPASTE.   Please read over the following fact sheets that you were given.

## 2019-05-15 ENCOUNTER — Other Ambulatory Visit: Payer: Self-pay

## 2019-05-15 ENCOUNTER — Encounter (HOSPITAL_COMMUNITY): Payer: Self-pay

## 2019-05-15 ENCOUNTER — Encounter (HOSPITAL_COMMUNITY)
Admission: RE | Admit: 2019-05-15 | Discharge: 2019-05-15 | Disposition: A | Discharge status: Home / Self Care | Payer: Medicare Other | Source: Ambulatory Visit | Attending: Neurosurgery | Admitting: Neurosurgery

## 2019-05-15 ENCOUNTER — Other Ambulatory Visit (HOSPITAL_COMMUNITY)
Admission: RE | Admit: 2019-05-15 | Discharge: 2019-05-15 | Disposition: A | Discharge status: Home / Self Care | Payer: Medicare Other | Source: Ambulatory Visit | Attending: Neurosurgery | Admitting: Neurosurgery

## 2019-05-15 DIAGNOSIS — Z01818 Encounter for other preprocedural examination: Secondary | ICD-10-CM | POA: Insufficient documentation

## 2019-05-15 DIAGNOSIS — I10 Essential (primary) hypertension: Secondary | ICD-10-CM | POA: Diagnosis not present

## 2019-05-15 DIAGNOSIS — Z20828 Contact with and (suspected) exposure to other viral communicable diseases: Secondary | ICD-10-CM | POA: Diagnosis not present

## 2019-05-15 LAB — BASIC METABOLIC PANEL
Anion gap: 13 (ref 5–15)
BUN: 12 mg/dL (ref 8–23)
CO2: 26 mmol/L (ref 22–32)
Calcium: 8.9 mg/dL (ref 8.9–10.3)
Chloride: 100 mmol/L (ref 98–111)
Creatinine, Ser: 0.9 mg/dL (ref 0.61–1.24)
GFR calc Af Amer: >60 mL/min (ref >60)
GFR calc non Af Amer: >60 mL/min (ref >60)
Glucose, Bld: 93 mg/dL (ref 70–99)
Potassium: 4.2 mmol/L (ref 3.5–5.1)
Sodium: 139 mmol/L (ref 135–145)

## 2019-05-15 LAB — TYPE AND SCREEN
ABO/RH(D): O NEG
Antibody Screen: NEGATIVE

## 2019-05-15 LAB — SURGICAL PCR SCREEN
MRSA, PCR: NEGATIVE
Staphylococcus aureus: NEGATIVE

## 2019-05-15 LAB — CBC
HCT: 50.2 % (ref 39.0–52.0)
Hemoglobin: 16.9 g/dL (ref 13.0–17.0)
MCH: 32.4 pg (ref 26.0–34.0)
MCHC: 33.7 g/dL (ref 30.0–36.0)
MCV: 96.2 fL (ref 80.0–100.0)
Platelets: 147 10*3/uL — ABNORMAL LOW (ref 150–400)
RBC: 5.22 MIL/uL (ref 4.22–5.81)
RDW: 13.2 % (ref 11.5–15.5)
WBC: 5.4 10*3/uL (ref 4.0–10.5)
nRBC: 0 % (ref 0.0–0.2)

## 2019-05-15 NOTE — Progress Notes (Signed)
PCP - Raelene Bott, MD Cardiologist - denies  Chest x-ray - N/A EKG - 05/15/2019 Stress Test - greater than 15 years ago ECHO - 10/2013 Cardiac Cath - denies  Sleep Study - 06/2017 CPAP - pt has one; patient refuses to wear it or bring it to the hospital  Blood Thinner Instructions: N/A Aspirin Instructions: Surgeon instructed to stop 5 days prior; however, patient states LD 04/27/2019  Anesthesia review: NO  Patient denies shortness of breath, fever, cough and chest pain at PAT appointment  COVID test scheduled for today, 05/15/2019; patient states verbal understanding of self-quarantine guidelines post testing  Coronavirus Screening  Have you experienced the following symptoms:  Cough yes/no: No Fever (>100.77F)  yes/no: No Runny nose yes/no: No Sore throat yes/no: No Difficulty breathing/shortness of breath  yes/no: No  Have you or a family member traveled in the last 14 days and where? yes/no: No   If the patient indicates "YES" to the above questions, their PAT will be rescheduled to limit the exposure to others and, the surgeon will be notified. THE PATIENT WILL NEED TO BE ASYMPTOMATIC FOR 14 DAYS.   If the patient is not experiencing any of these symptoms, the PAT nurse will instruct them to NOT bring anyone with them to their appointment since they may have these symptoms or traveled as well.   Please remind your patients and families that hospital visitation restrictions are in effect and the importance of the restrictions.   Patient verbalized understanding of instructions that were given to them at the PAT appointment. Patient was also instructed that they will need to review over the PAT instructions again at home before surgery.

## 2019-05-17 LAB — NOVEL CORONAVIRUS, NAA (HOSP ORDER, SEND-OUT TO REF LAB; TAT 18-24 HRS): SARS-CoV-2, NAA: NOT DETECTED

## 2019-05-18 NOTE — Anesthesia Preprocedure Evaluation (Addendum)
Anesthesia Evaluation  Patient identified by MRN, date of birth, ID band Patient awake    Reviewed: Allergy & Precautions, NPO status , Patient's Chart, lab work & pertinent test results  Airway Mallampati: III  TM Distance: >3 FB Neck ROM: Full    Dental  (+) Dental Advisory Given, Teeth Intact, Caps   Pulmonary sleep apnea ,    breath sounds clear to auscultation       Cardiovascular hypertension, Pt. on medications  Rhythm:Regular Rate:Normal     Neuro/Psych  Neuromuscular disease    GI/Hepatic negative GI ROS, Neg liver ROS,   Endo/Other  negative endocrine ROS  Renal/GU negative Renal ROS     Musculoskeletal  (+) Arthritis ,   Abdominal   Peds  Hematology negative hematology ROS (+)   Anesthesia Other Findings   Reproductive/Obstetrics                           Lab Results  Component Value Date   WBC 5.4 05/15/2019   HGB 16.9 05/15/2019   HCT 50.2 05/15/2019   MCV 96.2 05/15/2019   PLT 147 (L) 05/15/2019   Lab Results  Component Value Date   CREATININE 0.90 05/15/2019   BUN 12 05/15/2019   NA 139 05/15/2019   K 4.2 05/15/2019   CL 100 05/15/2019   CO2 26 05/15/2019    Anesthesia Physical Anesthesia Plan  ASA: II  Anesthesia Plan: General   Post-op Pain Management:    Induction: Intravenous  PONV Risk Score and Plan: 2 and Dexamethasone, Ondansetron and Treatment may vary due to age or medical condition  Airway Management Planned: Oral ETT  Additional Equipment:   Intra-op Plan:   Post-operative Plan: Extubation in OR  Informed Consent: I have reviewed the patients History and Physical, chart, labs and discussed the procedure including the risks, benefits and alternatives for the proposed anesthesia with the patient or authorized representative who has indicated his/her understanding and acceptance.     Dental advisory given  Plan Discussed with:  CRNA  Anesthesia Plan Comments:        Anesthesia Quick Evaluation

## 2019-05-19 ENCOUNTER — Inpatient Hospital Stay (HOSPITAL_COMMUNITY): Payer: Medicare Other | Admitting: Anesthesiology

## 2019-05-19 ENCOUNTER — Inpatient Hospital Stay (HOSPITAL_COMMUNITY): Payer: Medicare Other

## 2019-05-19 ENCOUNTER — Encounter (HOSPITAL_COMMUNITY)
Admission: RE | Disposition: A | Discharge status: Home / Self Care | Payer: Self-pay | Source: Home / Self Care | Attending: Neurosurgery

## 2019-05-19 ENCOUNTER — Inpatient Hospital Stay (HOSPITAL_COMMUNITY)
Admission: RE | Admit: 2019-05-19 | Discharge: 2019-05-20 | DRG: 455 | Disposition: A | Discharge status: Home / Self Care | Payer: Medicare Other | Attending: Neurosurgery | Admitting: Neurosurgery

## 2019-05-19 ENCOUNTER — Encounter (HOSPITAL_COMMUNITY): Payer: Self-pay | Admitting: Anesthesiology

## 2019-05-19 ENCOUNTER — Other Ambulatory Visit: Payer: Self-pay

## 2019-05-19 DIAGNOSIS — M1990 Unspecified osteoarthritis, unspecified site: Secondary | ICD-10-CM | POA: Diagnosis present

## 2019-05-19 DIAGNOSIS — M5416 Radiculopathy, lumbar region: Secondary | ICD-10-CM | POA: Diagnosis present

## 2019-05-19 DIAGNOSIS — I10 Essential (primary) hypertension: Secondary | ICD-10-CM | POA: Diagnosis present

## 2019-05-19 DIAGNOSIS — M5126 Other intervertebral disc displacement, lumbar region: Secondary | ICD-10-CM | POA: Diagnosis present

## 2019-05-19 DIAGNOSIS — Z79891 Long term (current) use of opiate analgesic: Secondary | ICD-10-CM

## 2019-05-19 DIAGNOSIS — M48061 Spinal stenosis, lumbar region without neurogenic claudication: Secondary | ICD-10-CM | POA: Diagnosis present

## 2019-05-19 DIAGNOSIS — Z981 Arthrodesis status: Secondary | ICD-10-CM | POA: Diagnosis not present

## 2019-05-19 DIAGNOSIS — Z79899 Other long term (current) drug therapy: Secondary | ICD-10-CM

## 2019-05-19 DIAGNOSIS — Z885 Allergy status to narcotic agent status: Secondary | ICD-10-CM | POA: Diagnosis not present

## 2019-05-19 DIAGNOSIS — Z419 Encounter for procedure for purposes other than remedying health state, unspecified: Secondary | ICD-10-CM

## 2019-05-19 DIAGNOSIS — Z888 Allergy status to other drugs, medicaments and biological substances status: Secondary | ICD-10-CM

## 2019-05-19 DIAGNOSIS — G4730 Sleep apnea, unspecified: Secondary | ICD-10-CM | POA: Diagnosis present

## 2019-05-19 SURGERY — POSTERIOR LUMBAR FUSION 1 LEVEL
Anesthesia: General | Site: Spine Lumbar

## 2019-05-19 MED ORDER — PHENYLEPHRINE 40 MCG/ML (10ML) SYRINGE FOR IV PUSH (FOR BLOOD PRESSURE SUPPORT)
PREFILLED_SYRINGE | INTRAVENOUS | Status: DC | PRN
  Administered 2019-05-19: 80 ug via INTRAVENOUS
  Administered 2019-05-19: 40 ug via INTRAVENOUS
  Administered 2019-05-19: 80 ug via INTRAVENOUS
  Administered 2019-05-19: 40 ug via INTRAVENOUS
  Administered 2019-05-19: 80 ug via INTRAVENOUS
  Administered 2019-05-19 (×2): 40 ug via INTRAVENOUS

## 2019-05-19 MED ORDER — ONDANSETRON HCL 4 MG/2ML IJ SOLN
INTRAMUSCULAR | Status: DC | PRN
  Administered 2019-05-19: 4 mg via INTRAVENOUS

## 2019-05-19 MED ORDER — HYDROMORPHONE HCL 1 MG/ML IJ SOLN
0.2500 mg | INTRAMUSCULAR | Status: DC | PRN
  Administered 2019-05-19: 0.25 mg via INTRAVENOUS

## 2019-05-19 MED ORDER — ACETAMINOPHEN 650 MG RE SUPP: 650.0000 mg | RECTAL | Status: DC | PRN

## 2019-05-19 MED ORDER — BUPIVACAINE HCL (PF) 0.5 % IJ SOLN
INTRAMUSCULAR | Status: AC
  Filled 2019-05-19: qty 30

## 2019-05-19 MED ORDER — LIDOCAINE-EPINEPHRINE 1 %-1:100000 IJ SOLN
INTRAMUSCULAR | Status: DC | PRN
  Administered 2019-05-19: 5 mL

## 2019-05-19 MED ORDER — DEXAMETHASONE SODIUM PHOSPHATE 10 MG/ML IJ SOLN
INTRAMUSCULAR | Status: AC
  Filled 2019-05-19: qty 1

## 2019-05-19 MED ORDER — DOCUSATE SODIUM 100 MG PO CAPS
100.0000 mg | ORAL_CAPSULE | Freq: Two times a day (BID) | ORAL | Status: DC
  Administered 2019-05-19 (×2): 100 mg via ORAL
  Filled 2019-05-19 (×2): qty 1

## 2019-05-19 MED ORDER — METHOCARBAMOL 1000 MG/10ML IJ SOLN
500.0000 mg | Freq: Four times a day (QID) | INTRAVENOUS | Status: DC | PRN
  Filled 2019-05-19: qty 5

## 2019-05-19 MED ORDER — OXYCODONE HCL 5 MG PO TABS: 5.0000 mg | ORAL_TABLET | Freq: Two times a day (BID) | ORAL | Status: DC | PRN

## 2019-05-19 MED ORDER — ACETAMINOPHEN 325 MG PO TABS: 650.0000 mg | ORAL_TABLET | ORAL | Status: DC | PRN

## 2019-05-19 MED ORDER — PROMETHAZINE HCL 25 MG/ML IJ SOLN: 6.2500 mg | INTRAMUSCULAR | Status: DC | PRN

## 2019-05-19 MED ORDER — SODIUM CHLORIDE 0.9 % IV SOLN
250.0000 mL | INTRAVENOUS | Status: DC
  Administered 2019-05-19: 15:00:00 250 mL via INTRAVENOUS

## 2019-05-19 MED ORDER — SODIUM CHLORIDE 0.9 % IV SOLN
INTRAVENOUS | Status: DC | PRN
  Administered 2019-05-19: 08:00:00 40 ug/min via INTRAVENOUS

## 2019-05-19 MED ORDER — ASPIRIN EC 81 MG PO TBEC
81.0000 mg | DELAYED_RELEASE_TABLET | Freq: Every day | ORAL | Status: DC
  Administered 2019-05-19: 15:00:00 81 mg via ORAL

## 2019-05-19 MED ORDER — THROMBIN 20000 UNITS EX SOLR
CUTANEOUS | Status: AC
  Filled 2019-05-19: qty 20000

## 2019-05-19 MED ORDER — ACETAMINOPHEN 10 MG/ML IV SOLN
1000.0000 mg | Freq: Once | INTRAVENOUS | Status: AC
  Administered 2019-05-19: 1000 mg via INTRAVENOUS
  Filled 2019-05-19: qty 100

## 2019-05-19 MED ORDER — POLYETHYLENE GLYCOL 3350 17 G PO PACK: 17.0000 g | PACK | Freq: Every day | ORAL | Status: DC | PRN

## 2019-05-19 MED ORDER — KETAMINE HCL 50 MG/5ML IJ SOSY
PREFILLED_SYRINGE | INTRAMUSCULAR | Status: AC
  Filled 2019-05-19: qty 5

## 2019-05-19 MED ORDER — 0.9 % SODIUM CHLORIDE (POUR BTL) OPTIME
TOPICAL | Status: DC | PRN
  Administered 2019-05-19: 09:00:00 1000 mL

## 2019-05-19 MED ORDER — MIDAZOLAM HCL 2 MG/2ML IJ SOLN
INTRAMUSCULAR | Status: AC
  Filled 2019-05-19: qty 2

## 2019-05-19 MED ORDER — HYDROCODONE-ACETAMINOPHEN 5-325 MG PO TABS: 1.0000 | ORAL_TABLET | ORAL | Status: DC | PRN

## 2019-05-19 MED ORDER — OXYCODONE HCL 5 MG PO TABS
ORAL_TABLET | ORAL | Status: AC
  Filled 2019-05-19: qty 2

## 2019-05-19 MED ORDER — PROPOFOL 10 MG/ML IV BOLUS
INTRAVENOUS | Status: AC
  Filled 2019-05-19: qty 20

## 2019-05-19 MED ORDER — CHLORHEXIDINE GLUCONATE CLOTH 2 % EX PADS: 6.0000 | MEDICATED_PAD | Freq: Once | CUTANEOUS | Status: DC

## 2019-05-19 MED ORDER — HYDROMORPHONE HCL 1 MG/ML IJ SOLN
0.2500 mg | INTRAMUSCULAR | Status: DC | PRN
  Administered 2019-05-19: 11:00:00 0.25 mg via INTRAVENOUS

## 2019-05-19 MED ORDER — ALUM & MAG HYDROXIDE-SIMETH 200-200-20 MG/5ML PO SUSP: 30.0000 mL | Freq: Four times a day (QID) | ORAL | Status: DC | PRN

## 2019-05-19 MED ORDER — HYDROMORPHONE HCL 1 MG/ML IJ SOLN
INTRAMUSCULAR | Status: AC
  Administered 2019-05-19: 0.25 mg via INTRAVENOUS
  Filled 2019-05-19: qty 1

## 2019-05-19 MED ORDER — ACETAMINOPHEN 10 MG/ML IV SOLN
INTRAVENOUS | Status: AC
  Filled 2019-05-19: qty 100

## 2019-05-19 MED ORDER — LACTATED RINGERS IV SOLN
INTRAVENOUS | Status: DC | PRN
  Administered 2019-05-19 (×2): via INTRAVENOUS

## 2019-05-19 MED ORDER — MIDAZOLAM HCL 5 MG/5ML IJ SOLN
INTRAMUSCULAR | Status: DC | PRN
  Administered 2019-05-19: 2 mg via INTRAVENOUS

## 2019-05-19 MED ORDER — ROCURONIUM BROMIDE 10 MG/ML (PF) SYRINGE
PREFILLED_SYRINGE | INTRAVENOUS | Status: DC | PRN
  Administered 2019-05-19: 10 mg via INTRAVENOUS
  Administered 2019-05-19: 60 mg via INTRAVENOUS

## 2019-05-19 MED ORDER — SODIUM CHLORIDE 0.9% FLUSH: 3.0000 mL | INTRAVENOUS | Status: DC | PRN

## 2019-05-19 MED ORDER — LORATADINE 10 MG PO TABS: 10.0000 mg | ORAL_TABLET | Freq: Every day | ORAL | Status: DC

## 2019-05-19 MED ORDER — THROMBIN 5000 UNITS EX SOLR
CUTANEOUS | Status: AC
  Filled 2019-05-19: qty 5000

## 2019-05-19 MED ORDER — BUPIVACAINE LIPOSOME 1.3 % IJ SUSP
INTRAMUSCULAR | Status: DC | PRN
  Administered 2019-05-19: 20 mL

## 2019-05-19 MED ORDER — OXYCODONE HCL 5 MG PO TABS
10.0000 mg | ORAL_TABLET | ORAL | Status: DC | PRN
  Administered 2019-05-19 – 2019-05-20 (×8): 10 mg via ORAL
  Filled 2019-05-19 (×7): qty 2

## 2019-05-19 MED ORDER — PHENYLEPHRINE 40 MCG/ML (10ML) SYRINGE FOR IV PUSH (FOR BLOOD PRESSURE SUPPORT)
PREFILLED_SYRINGE | INTRAVENOUS | Status: AC
  Filled 2019-05-19: qty 10

## 2019-05-19 MED ORDER — PHENOL 1.4 % MT LIQD: 1.0000 | OROMUCOSAL | Status: DC | PRN

## 2019-05-19 MED ORDER — PANTOPRAZOLE SODIUM 40 MG IV SOLR: 40.0000 mg | Freq: Every day | INTRAVENOUS | Status: DC

## 2019-05-19 MED ORDER — METHOCARBAMOL 500 MG PO TABS
500.0000 mg | ORAL_TABLET | Freq: Four times a day (QID) | ORAL | Status: DC | PRN
  Administered 2019-05-19 – 2019-05-20 (×3): 500 mg via ORAL
  Filled 2019-05-19 (×3): qty 1

## 2019-05-19 MED ORDER — CEFAZOLIN SODIUM-DEXTROSE 2-4 GM/100ML-% IV SOLN
2.0000 g | Freq: Three times a day (TID) | INTRAVENOUS | Status: AC
  Administered 2019-05-19 (×2): 2 g via INTRAVENOUS
  Filled 2019-05-19 (×2): qty 100

## 2019-05-19 MED ORDER — THROMBIN 20000 UNITS EX SOLR
CUTANEOUS | Status: DC | PRN
  Administered 2019-05-19: 20 mL via TOPICAL

## 2019-05-19 MED ORDER — METHOCARBAMOL 500 MG PO TABS
ORAL_TABLET | ORAL | Status: AC
  Administered 2019-05-19: 18:00:00 500 mg
  Filled 2019-05-19: qty 1

## 2019-05-19 MED ORDER — DEXAMETHASONE SODIUM PHOSPHATE 10 MG/ML IJ SOLN
INTRAMUSCULAR | Status: DC | PRN
  Administered 2019-05-19: 10 mg via INTRAVENOUS

## 2019-05-19 MED ORDER — FENTANYL CITRATE (PF) 100 MCG/2ML IJ SOLN
INTRAMUSCULAR | Status: DC | PRN
  Administered 2019-05-19: 100 ug via INTRAVENOUS
  Administered 2019-05-19 (×3): 50 ug via INTRAVENOUS

## 2019-05-19 MED ORDER — FLEET ENEMA 7-19 GM/118ML RE ENEM: 1.0000 | ENEMA | Freq: Once | RECTAL | Status: DC | PRN

## 2019-05-19 MED ORDER — MORPHINE SULFATE (PF) 2 MG/ML IV SOLN
2.0000 mg | INTRAVENOUS | Status: DC | PRN
  Administered 2019-05-19: 2 mg via INTRAVENOUS
  Filled 2019-05-19: qty 1

## 2019-05-19 MED ORDER — LIDOCAINE 2% (20 MG/ML) 5 ML SYRINGE
INTRAMUSCULAR | Status: DC | PRN
  Administered 2019-05-19: 80 mg via INTRAVENOUS

## 2019-05-19 MED ORDER — THROMBIN 5000 UNITS EX SOLR
OROMUCOSAL | Status: DC | PRN
  Administered 2019-05-19: 5 mL via TOPICAL

## 2019-05-19 MED ORDER — HYDROCHLOROTHIAZIDE 12.5 MG PO CAPS
12.5000 mg | ORAL_CAPSULE | Freq: Every day | ORAL | Status: DC
  Administered 2019-05-19: 12.5 mg via ORAL
  Filled 2019-05-19: qty 1

## 2019-05-19 MED ORDER — ONDANSETRON HCL 4 MG/2ML IJ SOLN
INTRAMUSCULAR | Status: AC
  Filled 2019-05-19: qty 2

## 2019-05-19 MED ORDER — FENTANYL CITRATE (PF) 250 MCG/5ML IJ SOLN
INTRAMUSCULAR | Status: AC
  Filled 2019-05-19: qty 5

## 2019-05-19 MED ORDER — KCL IN DEXTROSE-NACL 20-5-0.45 MEQ/L-%-% IV SOLN: INTRAVENOUS | Status: DC

## 2019-05-19 MED ORDER — MENTHOL 3 MG MT LOZG: 1.0000 | LOZENGE | OROMUCOSAL | Status: DC | PRN

## 2019-05-19 MED ORDER — LIDOCAINE 2% (20 MG/ML) 5 ML SYRINGE
INTRAMUSCULAR | Status: AC
  Filled 2019-05-19: qty 5

## 2019-05-19 MED ORDER — SUGAMMADEX SODIUM 200 MG/2ML IV SOLN
INTRAVENOUS | Status: DC | PRN
  Administered 2019-05-19: 200 mg via INTRAVENOUS

## 2019-05-19 MED ORDER — BUPIVACAINE LIPOSOME 1.3 % IJ SUSP
20.0000 mL | Freq: Once | INTRAMUSCULAR | Status: DC
  Filled 2019-05-19: qty 20

## 2019-05-19 MED ORDER — LIDOCAINE-EPINEPHRINE 1 %-1:100000 IJ SOLN
INTRAMUSCULAR | Status: AC
  Filled 2019-05-19: qty 1

## 2019-05-19 MED ORDER — SODIUM CHLORIDE 0.9% FLUSH
3.0000 mL | Freq: Two times a day (BID) | INTRAVENOUS | Status: DC
  Administered 2019-05-19 (×2): 3 mL via INTRAVENOUS

## 2019-05-19 MED ORDER — ROCURONIUM BROMIDE 10 MG/ML (PF) SYRINGE
PREFILLED_SYRINGE | INTRAVENOUS | Status: AC
  Filled 2019-05-19: qty 10

## 2019-05-19 MED ORDER — CEFAZOLIN SODIUM-DEXTROSE 2-4 GM/100ML-% IV SOLN
2.0000 g | INTRAVENOUS | Status: AC
  Administered 2019-05-19: 2 g via INTRAVENOUS
  Filled 2019-05-19: qty 100

## 2019-05-19 MED ORDER — ONDANSETRON HCL 4 MG/2ML IJ SOLN: 4.0000 mg | Freq: Four times a day (QID) | INTRAMUSCULAR | Status: DC | PRN

## 2019-05-19 MED ORDER — BUPIVACAINE HCL (PF) 0.5 % IJ SOLN
INTRAMUSCULAR | Status: DC | PRN
  Administered 2019-05-19: 5 mL

## 2019-05-19 MED ORDER — ONDANSETRON HCL 4 MG PO TABS: 4.0000 mg | ORAL_TABLET | Freq: Four times a day (QID) | ORAL | Status: DC | PRN

## 2019-05-19 MED ORDER — PROPOFOL 10 MG/ML IV BOLUS
INTRAVENOUS | Status: DC | PRN
  Administered 2019-05-19: 200 mg via INTRAVENOUS

## 2019-05-19 MED ORDER — ZOLPIDEM TARTRATE 5 MG PO TABS
5.0000 mg | ORAL_TABLET | Freq: Every evening | ORAL | Status: DC | PRN
  Administered 2019-05-19: 5 mg via ORAL
  Filled 2019-05-19: qty 1

## 2019-05-19 MED ORDER — LISINOPRIL 20 MG PO TABS
20.0000 mg | ORAL_TABLET | Freq: Every day | ORAL | Status: DC
  Administered 2019-05-19: 20 mg via ORAL
  Filled 2019-05-19: qty 1

## 2019-05-19 MED ORDER — BISACODYL 10 MG RE SUPP: 10.0000 mg | Freq: Every day | RECTAL | Status: DC | PRN

## 2019-05-19 MED ORDER — KETAMINE HCL 50 MG/ML IJ SOLN
INTRAMUSCULAR | Status: DC | PRN
  Administered 2019-05-19: 50 mg via INTRAMUSCULAR

## 2019-05-19 MED ORDER — PANTOPRAZOLE SODIUM 40 MG PO TBEC
40.0000 mg | DELAYED_RELEASE_TABLET | Freq: Every day | ORAL | Status: DC
  Administered 2019-05-19: 21:00:00 40 mg via ORAL
  Filled 2019-05-19: qty 1

## 2019-05-19 MED ORDER — LISINOPRIL-HYDROCHLOROTHIAZIDE 20-12.5 MG PO TABS: 1.0000 | ORAL_TABLET | Freq: Every day | ORAL | Status: DC

## 2019-05-19 SURGICAL SUPPLY — 74 items
ADH SKN CLS APL DERMABOND .7 (GAUZE/BANDAGES/DRESSINGS) ×1
BASKET BONE COLLECTION (BASKET) ×3 IMPLANT
BLADE CLIPPER SURG (BLADE) ×2 IMPLANT
BONE CANC CHIPS 20CC PCAN1/4 (Bone Implant) ×3 IMPLANT
BUR MATCHSTICK NEURO 3.0 LAGG (BURR) ×3 IMPLANT
BUR PRECISION FLUTE 5.0 (BURR) ×3 IMPLANT
CAGE COROENT LG 10X9X23-12 (Cage) ×4 IMPLANT
CANISTER SUCT 3000ML PPV (MISCELLANEOUS) ×3 IMPLANT
CARTRIDGE OIL MAESTRO DRILL (MISCELLANEOUS) ×1 IMPLANT
CHIPS CANC BONE 20CC PCAN1/4 (Bone Implant) ×1 IMPLANT
CONT SPEC 4OZ CLIKSEAL STRL BL (MISCELLANEOUS) ×3 IMPLANT
COVER BACK TABLE 60X90IN (DRAPES) ×3 IMPLANT
DERMABOND ADVANCED (GAUZE/BANDAGES/DRESSINGS) ×2
DERMABOND ADVANCED .7 DNX12 (GAUZE/BANDAGES/DRESSINGS) ×1 IMPLANT
DIFFUSER DRILL AIR PNEUMATIC (MISCELLANEOUS) ×3 IMPLANT
DRAPE C-ARM 42X72 X-RAY (DRAPES) ×3 IMPLANT
DRAPE C-ARMOR (DRAPES) ×3 IMPLANT
DRAPE LAPAROTOMY 100X72X124 (DRAPES) ×3 IMPLANT
DRAPE SURG 17X23 STRL (DRAPES) ×3 IMPLANT
DRSG OPSITE POSTOP 4X6 (GAUZE/BANDAGES/DRESSINGS) ×2 IMPLANT
DURAPREP 26ML APPLICATOR (WOUND CARE) ×3 IMPLANT
ELECT REM PT RETURN 9FT ADLT (ELECTROSURGICAL) ×3
ELECTRODE REM PT RTRN 9FT ADLT (ELECTROSURGICAL) ×1 IMPLANT
EVACUATOR 1/8 PVC DRAIN (DRAIN) IMPLANT
GAUZE 4X4 16PLY RFD (DISPOSABLE) IMPLANT
GAUZE SPONGE 4X4 12PLY STRL (GAUZE/BANDAGES/DRESSINGS) ×3 IMPLANT
GLOVE BIO SURGEON STRL SZ8 (GLOVE) ×6 IMPLANT
GLOVE BIOGEL PI IND STRL 7.0 (GLOVE) IMPLANT
GLOVE BIOGEL PI IND STRL 7.5 (GLOVE) IMPLANT
GLOVE BIOGEL PI IND STRL 8 (GLOVE) ×2 IMPLANT
GLOVE BIOGEL PI IND STRL 8.5 (GLOVE) ×2 IMPLANT
GLOVE BIOGEL PI INDICATOR 7.0 (GLOVE) ×6
GLOVE BIOGEL PI INDICATOR 7.5 (GLOVE) ×6
GLOVE BIOGEL PI INDICATOR 8 (GLOVE) ×4
GLOVE BIOGEL PI INDICATOR 8.5 (GLOVE) ×4
GLOVE ECLIPSE 8.0 STRL XLNG CF (GLOVE) ×6 IMPLANT
GLOVE ECLIPSE 9.0 STRL (GLOVE) ×2 IMPLANT
GLOVE SS N UNI LF 6.5 STRL (GLOVE) ×4 IMPLANT
GLOVE SS N UNI LF 7.0 STRL (GLOVE) ×4 IMPLANT
GOWN STRL REUS W/ TWL LRG LVL3 (GOWN DISPOSABLE) IMPLANT
GOWN STRL REUS W/ TWL XL LVL3 (GOWN DISPOSABLE) ×2 IMPLANT
GOWN STRL REUS W/TWL 2XL LVL3 (GOWN DISPOSABLE) ×6 IMPLANT
GOWN STRL REUS W/TWL LRG LVL3 (GOWN DISPOSABLE)
GOWN STRL REUS W/TWL XL LVL3 (GOWN DISPOSABLE) ×12
GRAFT BNE CANC CHIPS 1-8 20CC (Bone Implant) IMPLANT
HEMOSTAT POWDER KIT SURGIFOAM (HEMOSTASIS) ×3 IMPLANT
KIT BASIN OR (CUSTOM PROCEDURE TRAY) ×3 IMPLANT
KIT INFUSE XX SMALL 0.7CC (Orthopedic Implant) ×2 IMPLANT
KIT POSITION SURG JACKSON T1 (MISCELLANEOUS) ×3 IMPLANT
KIT TURNOVER KIT B (KITS) ×3 IMPLANT
MARKER SKIN DUAL TIP RULER LAB (MISCELLANEOUS) ×4 IMPLANT
MILL MEDIUM DISP (BLADE) ×2 IMPLANT
NDL HYPO 25X1 1.5 SAFETY (NEEDLE) ×1 IMPLANT
NDL SPNL 18GX3.5 QUINCKE PK (NEEDLE) IMPLANT
NEEDLE HYPO 25X1 1.5 SAFETY (NEEDLE) ×3 IMPLANT
NEEDLE SPNL 18GX3.5 QUINCKE PK (NEEDLE) IMPLANT
NS IRRIG 1000ML POUR BTL (IV SOLUTION) ×3 IMPLANT
OIL CARTRIDGE MAESTRO DRILL (MISCELLANEOUS) ×3
PACK LAMINECTOMY NEURO (CUSTOM PROCEDURE TRAY) ×3 IMPLANT
ROD RELIN-O LORD 5.5X65MM (Rod) ×2 IMPLANT
ROD RELINE O-H CON M 5.0/6.0MM (Rod) ×4 IMPLANT
ROD RELINE-O LORDOTIC 5.5X60MM (Rod) ×2 IMPLANT
SCREW LOCK RELINE 5.5 TULIP (Screw) ×8 IMPLANT
SCREW RELINE-O POLY 7.5X50 (Screw) ×6 IMPLANT
SCREW RLINE PLY 2S 50X7.5XPA (Screw) IMPLANT
SPONGE SURGIFOAM ABS GEL 100 (HEMOSTASIS) ×2 IMPLANT
SUT VIC AB 1 CT1 18XBRD ANBCTR (SUTURE) ×2 IMPLANT
SUT VIC AB 1 CT1 8-18 (SUTURE) ×6
SUT VIC AB 2-0 CT1 18 (SUTURE) ×6 IMPLANT
SUT VIC AB 3-0 SH 8-18 (SUTURE) ×6 IMPLANT
TOWEL GREEN STERILE (TOWEL DISPOSABLE) ×3 IMPLANT
TOWEL GREEN STERILE FF (TOWEL DISPOSABLE) ×3 IMPLANT
TRAY FOLEY MTR SLVR 16FR STAT (SET/KITS/TRAYS/PACK) ×3 IMPLANT
WATER STERILE IRR 1000ML POUR (IV SOLUTION) ×3 IMPLANT

## 2019-05-19 NOTE — Transfer of Care (Signed)
Immediate Anesthesia Transfer of Care Note  Patient: Daniel Johns  Procedure(s) Performed: Lumbar Five- Sacral One Posterior Lumbar Interbody Fusion with Extension of Existing Fusion (N/A Spine Lumbar)  Patient Location: PACU  Anesthesia Type:General  Level of Consciousness: awake, oriented and patient cooperative  Airway & Oxygen Therapy: Patient Spontanous Breathing and Patient connected to face mask oxygen  Post-op Assessment: Report given to RN and Post -op Vital signs reviewed and unstable, Anesthesiologist notified  Post vital signs: Reviewed  Last Vitals:  Vitals Value Taken Time  BP 110/69 05/19/19 1046  Temp    Pulse 92 05/19/19 1048  Resp 23 05/19/19 1048  SpO2 98 % 05/19/19 1048  Vitals shown include unvalidated device data.  Last Pain:  Vitals:   05/19/19 0615  TempSrc:   PainSc: 2          Complications: No apparent anesthesia complications

## 2019-05-19 NOTE — Evaluation (Signed)
Physical Therapy Evaluation Patient Details Name: Daniel Johns MRN: [id number] DOB: 1951-06-24 Today's Date: 05/19/2019   History of Present Illness  Patient is a 68 y/o male who presents s/p L5-S1 PLIF with extension of existing fusion. PMH includes HTN, bil TKA, left sh surgery, cervical surgery.  Clinical Impression  Patient presents with post surgical deficits s/p above surgery. Pt independent PTA and lives with wife. Today, pt tolerated bed mobility, transfers and gait training with min guard-Mod I for safety. Pt reports feeling slightly unsteady during gait. Will need to negotiate stairs prior to d/c. Education re: back precautions, handout, positioning, log roll technique, walking program etc. Will follow acutely to maximize independence and mobility prior to return home.     Follow Up Recommendations No PT follow up;Supervision - Intermittent    Equipment Recommendations  None recommended by PT    Recommendations for Other Services       Precautions / Restrictions Precautions Precautions: Fall;Back Precaution Booklet Issued: Yes (comment) Precaution Comments: Provided handout and reviewed precautions Required Braces or Orthoses: Spinal Brace Spinal Brace: Lumbar corset;Applied in standing position Restrictions Weight Bearing Restrictions: No      Mobility  Bed Mobility Overal bed mobility: Needs Assistance Bed Mobility: Rolling;Sidelying to Sit;Sit to Sidelying Rolling: Modified independent (Device/Increase time) Sidelying to sit: Modified independent (Device/Increase time)     Sit to sidelying: Modified independent (Device/Increase time) General bed mobility comments: HOB flat, no use of rail to simualte home.  Transfers Overall transfer level: Needs assistance Equipment used: None Transfers: Sit to/from Stand Sit to Stand: Min guard         General transfer comment: Min guard for safety. Slow to rise.  Ambulation/Gait Ambulation/Gait assistance: Min  guard Gait Distance (Feet): 400 Feet Assistive device: None Gait Pattern/deviations: Step-through pattern;Decreased stride length;Wide base of support Gait velocity: decreased   General Gait Details: Slow, guarded gait with close Min guard for safety. Reaching for rail occasionally but balance improved with icnreased distance.  Stairs            Wheelchair Mobility    Modified Rankin (Stroke Patients Only)       Balance Overall balance assessment: Mild deficits observed, not formally tested                                           Pertinent Vitals/Pain Pain Assessment: No/denies pain    Home Living Family/patient expects to be discharged to:: Private residence Living Arrangements: Spouse/significant other Available Help at Discharge: Family;Available 24 hours/day Type of Home: House Home Access: Stairs to enter Entrance Stairs-Rails: Right Entrance Stairs-Number of Steps: 3 Home Layout: Two level Home Equipment: Walker - 2 wheels;Bedside commode;Shower seat - built Investment banker, operational      Prior Function Level of Independence: Independent         Comments: Enjoys Quarry manager Dominance   Dominant Hand: Right    Extremity/Trunk Assessment   Upper Extremity Assessment Upper Extremity Assessment: Defer to OT evaluation    Lower Extremity Assessment Lower Extremity Assessment: Overall WFL for tasks assessed    Cervical / Trunk Assessment Cervical / Trunk Assessment: Other exceptions Cervical / Trunk Exceptions: s/p back surgery  Communication   Communication: No difficulties  Cognition Arousal/Alertness: Awake/alert Behavior During Therapy: WFL for tasks assessed/performed Overall Cognitive Status: Within Functional Limits for tasks assessed  General Comments General comments (skin integrity, edema, etc.): Dressing- clean, dry and intact.    Exercises      Assessment/Plan    PT Assessment Patient needs continued PT services  PT Problem List Decreased mobility;Decreased knowledge of precautions;Decreased balance       PT Treatment Interventions Stair training;Gait training;Therapeutic exercise;Therapeutic activities;Patient/family education    PT Goals (Current goals can be found in the Care Plan section)  Acute Rehab PT Goals Patient Stated Goal: to get back to golfing PT Goal Formulation: With patient Time For Goal Achievement: 06/02/19 Potential to Achieve Goals: Good    Frequency Min 5X/week   Barriers to discharge Inaccessible home environment stairs    Co-evaluation               AM-PAC PT "6 Clicks" Mobility  Outcome Measure Help needed turning from your back to your side while in a flat bed without using bedrails?: None Help needed moving from lying on your back to sitting on the side of a flat bed without using bedrails?: None Help needed moving to and from a bed to a chair (including a wheelchair)?: A Little Help needed standing up from a chair using your arms (e.g., wheelchair or bedside chair)?: A Little Help needed to walk in hospital room?: A Little Help needed climbing 3-5 steps with a railing? : A Little 6 Click Score: 20    End of Session Equipment Utilized During Treatment: Back brace Activity Tolerance: Patient tolerated treatment well Patient left: in bed;with call bell/phone within reach;with family/visitor present;with SCD's reapplied Nurse Communication: Mobility status PT Visit Diagnosis: Difficulty in walking, not elsewhere classified (R26.2)    Time: [id number] PT Time Calculation (min) (ACUTE ONLY): 21 min   Charges:   PT Evaluation $PT Eval Moderate Complexity: 1 Mod          Mylo RedShauna Tyde Lamison, PT, DPT Acute Rehabilitation Services Pager [phone] Office [phone]      Blake DivineShauna A Lanier EnsignHartshorne 05/19/2019, 3:06 PM

## 2019-05-19 NOTE — H&P (Signed)
  Patient ID:   [id number] Patient: Daniel Johns  Date of Birth: December 20, 1950 Visit Type: Office Visit   Date: 04/08/2019 12:30 PM Provider: Marchia Meiers. Vertell Limber MD   This 68 year old male presents for back pain.  HISTORY OF PRESENT ILLNESS:  1.  back pain  Patient reports that his bilateral back pain has returned. He reports that he takes at least 1 oxycodone per day. He reports his back pain is worse on the left side. Patient reports that gabapentin is no longer offering relief. Patient states that he is tired of taking pills.         Medical/Surgical/Interim History Reviewed, no change.  Last detailed document date:03/10/2018.     Family History:  Reviewed, no changes.  Last detailed document date:03/10/2018.   Social History: Reviewed, no changes. Last detailed document date: 03/10/2018.    MEDICATIONS: (added, continued or stopped this visit) Started Medication Directions Instruction Stopped  01/23/2018 ibuprofen 600 mg tablet     06/26/2018 lisinopril 20 mg-hydrochlorothiazide 12.5 mg tablet     03/18/2019 Neurontin 100 mg capsule Take as prescribed, titration schedule provided to patient in office, increase to max of 3 PO TID    03/18/2019 oxycodone 5 mg tablet take 1 tablet by oral route  every 6 - 12 hours as needed       ALLERGIES: Ingredient Reaction Medication Name Comment  NO KNOWN ALLERGIES     No known allergies.    PHYSICAL EXAM:   Vitals Date Temp F BP Pulse Ht In Wt Lb BMI BSA Pain Score  04/08/2019 97.7 148/102 97 71 228 31.8  10/10      PLAN:  1) L5-S1 decompression and fusion with exploration of L 3 - L 5 fusion  Orders: Instruction(s)/Education: Assessment Instruction  I10 Hypertension education  Z68.31 Lifestyle education regarding diet   Completed Orders (this encounter) Order Details Reason Side Interpretation Result Initial Treatment Date Region  Hypertension education Patient to follow up with primary care provider.         Lifestyle education regarding diet Patient encouraged to eat a well balanced diet.         Assessment/Plan   # Detail Type Description   1. Assessment Essential (primary) hypertension (I10).       2. Assessment Body mass index (BMI) 31.0-31.9, adult (Z68.31).   Plan Orders Today's instructions / counseling include(s) Lifestyle education regarding diet. Clinical information/comments: Patient encouraged to eat a well balanced diet.         Pain Management Plan Pain Scale: 10/10. Method: Numeric Pain Intensity Scale.              Provider:  Marchia Meiers. Vertell Limber MD  04/08/2019 01:07 PM    Dictation edited by: Judd Gaudier    CC Providers: Nani Skillern [place],  [place]  [id number]-   [name]  [facility] [address] [place], [place] [address]-               Electronically signed by Marchia Meiers Vertell Limber MD on 04/11/2019 01:43 PM

## 2019-05-19 NOTE — Anesthesia Procedure Notes (Signed)
Procedure Name: Intubation Date/Time: 05/19/2019 7:39 AM Performed by: Jenne Campus, CRNA Pre-anesthesia Checklist: Patient identified, Emergency Drugs available, Suction available, Patient being monitored and Timeout performed Patient Re-evaluated:Patient Re-evaluated prior to induction Oxygen Delivery Method: Circle system utilized Preoxygenation: Pre-oxygenation with 100% oxygen Induction Type: IV induction Ventilation: Oral airway inserted - appropriate to patient size Laryngoscope Size: Miller and 3 Grade View: Grade I Tube type: Oral Tube size: 7.5 mm Number of attempts: 1 Airway Equipment and Method: Stylet Placement Confirmation: ETT inserted through vocal cords under direct vision,  positive ETCO2 and breath sounds checked- equal and bilateral Secured at: 23 cm Tube secured with: Tape Dental Injury: Teeth and Oropharynx as per pre-operative assessment

## 2019-05-19 NOTE — Progress Notes (Signed)
Awake, alert, conversant.  MAEW with good strength.  Doing well. 

## 2019-05-19 NOTE — Op Note (Signed)
05/19/2019  10:47 AM  PATIENT:  Daniel Johns  68 y.o. male  PRE-OPERATIVE DIAGNOSIS:  Spinal stenosis, Lumbar region without neurogenic claudication, lumbar disc herniation, foraminal stenosis, radiculopathy, lumbago L 5 S 1 level  POST-OPERATIVE DIAGNOSIS:  Spinal stenosis, Lumbar region without neurogenic claudication, lumbar disc herniation, foraminal stenosis, radiculopathy, lumbago L 5 S 1 level   PROCEDURE:  Procedure(s) with comments: Lumbar Five- Sacral One Posterior Lumbar Interbody Fusion with Extension of Existing Fusion (N/A) - Lumbar 5 Sacral 1 Posterior lumbar interbody fusion with exploration of adjacent level fusion with posterolateral arthrodesis  SURGEON:  Surgeon(s) and Role:    * Yovany Clock, MD - Primary    * Pool, Henry, MD - Assisting  PHYSICIAN ASSISTANT:   ASSISTANTS: Poteat, RN   ANESTHESIA:   general  EBL:  300 mL   BLOOD ADMINISTERED:none  DRAINS: none   LOCAL MEDICATIONS USED:  MARCAINE    and LIDOCAINE   SPECIMEN:  No Specimen  DISPOSITION OF SPECIMEN:  N/A  COUNTS:  YES  TOURNIQUET:  * No tourniquets in log *  DICTATION: Patient is 68-year-old man with lumbar stenosis and previous fusion L 34 and L 45 levels with foraminal stenosis at  L 5S1 and severe back and bilateral leg pain.  It was elected to take him to surgery for exploration of previous fusion with decompression and fusion from L 5 S 1 level.   Procedure: Patient was placed in a prone position on the Jackson table after smooth and uncomplicated induction of general endotracheal anesthesia. His low back was prepped and draped in usual sterile fashion with betadine scrub and DuraPrep. Area of incision was infiltrated with local lidocaine. Incision was made to the lumbodorsal fascia was incised and exposure was performed of the L 5 S 1 spinous processes laminae facet joint and transverse processes. Previous hardware was exposed.  This appeared to be solid without any loosening.  Connectors were placed bilaterally on the rods between the L 4 and L 5 screws bilaterally and torqued tight.   Intraoperative x-ray was obtained which confirmed correct orientation with marker probes at L5  through S 1  levels. A bilateral hemi-laminectomy of L 5 through S 1  levels was performed with disarticulation of the facet joints and thorough decompression was performed of both L5 , S 1  nerve roots. Decompression was greater than would be typical for PLIF. A thorough discectomy was then performed on the left with preparation of the endplates for grafting a trial spacer was placed this level and a thorough discectomy was performed on the right as well at L 5 S 1.  After trial sizing and utilization of sequential shavers, interspaces were packed with autograft 10mm  X 23 x 12 degree lordotic PEEK cages with 20 cc of autograft in the interspace and extra extra small BMP in the cages.   The posterolateral region was decorticated and pedicle probes were placed at S  1 bilaterally. Intraoperative fluoroscopy confirmed correct orientationin the AP and lateral plane. 50 x 7.5 mm pedicle screws were placed at S 1 bilaterally.   Final x-rays demonstrated well-positioned interbody grafts and pedicle screw fixation. A 60 mm lordotic rod was placed on the right and a 65 mm rod was placed on the left locked down in situ and the posterolateral region was packed with allograft bone graft bilaterally and remaining BMP.The wound was irrigated. Long-acting Marcaine was injected in the deep musculature.   Fascia was closed with 1 Vicryl sutures   skin edges were reapproximated 2 and 3-0 Vicryl sutures. The wound was dressed with Dermabond and  an occlusive dressing the patient was extubated in the operating room and taken to recovery in stable satisfactory condition he tolerated the operation well counts were correct at the end of the case.   PLAN OF CARE: Admit to inpatient   PATIENT DISPOSITION:  PACU - hemodynamically  stable.   Delay start of Pharmacological VTE agent (>24hrs) due to surgical blood loss or risk of bleeding: yes

## 2019-05-19 NOTE — Interval H&P Note (Signed)
History and Physical Interval Note:  05/19/2019 7:30 AM  Daniel Johns  has presented today for surgery, with the diagnosis of Spinal stenosis, Lumbar region without neurogenic claudication.  The various methods of treatment have been discussed with the patient and family. After consideration of risks, benefits and other options for treatment, the patient has consented to  Procedure(s) with comments: Lumbar 5 Sacral 1 Posterior lumbar interbody fusion with exploration of adjacent level fusion (N/A) - Lumbar 5 Sacral 1 Posterior lumbar interbody fusion with exploration of adjacent level fusion as a surgical intervention.  The patient's history has been reviewed, patient examined, no change in status, stable for surgery.  I have reviewed the patient's chart and labs.  Questions were answered to the patient's satisfaction.     Peggyann Shoals

## 2019-05-19 NOTE — Anesthesia Postprocedure Evaluation (Signed)
Anesthesia Post Note  Patient: Daniel Johns  Procedure(s) Performed: Lumbar Five- Sacral One Posterior Lumbar Interbody Fusion with Extension of Existing Fusion (N/A Spine Lumbar)     Patient location during evaluation: PACU Anesthesia Type: General Level of consciousness: awake and alert Pain management: pain level controlled Vital Signs Assessment: post-procedure vital signs reviewed and stable Respiratory status: spontaneous breathing, nonlabored ventilation, respiratory function stable and patient connected to nasal cannula oxygen Cardiovascular status: blood pressure returned to baseline and stable Postop Assessment: no apparent nausea or vomiting Anesthetic complications: no    Last Vitals:  Vitals:   05/19/19 1155 05/19/19 1207  BP:  138/81  Pulse: 91 100  Resp: 13 16  Temp: (!) 36.1 C 36.7 C  SpO2: 96% 96%    Last Pain:  Vitals:   05/19/19 1207  TempSrc: Oral  PainSc:                  Tiajuana Amass

## 2019-05-19 NOTE — Brief Op Note (Signed)
05/19/2019  10:47 AM  PATIENT:  Daniel Johns  68 y.o. male  PRE-OPERATIVE DIAGNOSIS:  Spinal stenosis, Lumbar region without neurogenic claudication, lumbar disc herniation, foraminal stenosis, radiculopathy, lumbago L 5 S 1 level  POST-OPERATIVE DIAGNOSIS:  Spinal stenosis, Lumbar region without neurogenic claudication, lumbar disc herniation, foraminal stenosis, radiculopathy, lumbago L 5 S 1 level   PROCEDURE:  Procedure(s) with comments: Lumbar Five- Sacral One Posterior Lumbar Interbody Fusion with Extension of Existing Fusion (N/A) - Lumbar 5 Sacral 1 Posterior lumbar interbody fusion with exploration of adjacent level fusion with posterolateral arthrodesis  SURGEON:  Surgeon(s) and Role:    Maeola Harman* Whitney Hillegass, MD - Primary    Julio Sicks* Pool, Henry, MD - Assisting  PHYSICIAN ASSISTANT:   ASSISTANTS: Poteat, RN   ANESTHESIA:   general  EBL:  300 mL   BLOOD ADMINISTERED:none  DRAINS: none   LOCAL MEDICATIONS USED:  MARCAINE    and LIDOCAINE   SPECIMEN:  No Specimen  DISPOSITION OF SPECIMEN:  N/A  COUNTS:  YES  TOURNIQUET:  * No tourniquets in log *  DICTATION: Patient is 68 year old man with lumbar stenosis and previous fusion L 34 and L 45 levels with foraminal stenosis at  L 5S1 and severe back and bilateral leg pain.  It was elected to take him to surgery for exploration of previous fusion with decompression and fusion from L 5 S 1 level.   Procedure: Patient was placed in a prone position on the [place] table after smooth and uncomplicated induction of general endotracheal anesthesia. His low back was prepped and draped in usual sterile fashion with betadine scrub and DuraPrep. Area of incision was infiltrated with local lidocaine. Incision was made to the lumbodorsal fascia was incised and exposure was performed of the L 5 S 1 spinous processes laminae facet joint and transverse processes. Previous hardware was exposed.  This appeared to be solid without any loosening.  Connectors were placed bilaterally on the rods between the L 4 and L 5 screws bilaterally and torqued tight.   Intraoperative x-ray was obtained which confirmed correct orientation with marker probes at L5  through S 1  levels. A bilateral hemi-laminectomy of L 5 through S 1  levels was performed with disarticulation of the facet joints and thorough decompression was performed of both L5 , S 1  nerve roots. Decompression was greater than would be typical for PLIF. A thorough discectomy was then performed on the left with preparation of the endplates for grafting a trial spacer was placed this level and a thorough discectomy was performed on the right as well at L 5 S 1.  After trial sizing and utilization of sequential shavers, interspaces were packed with autograft 10mm  X 23 x 12 degree lordotic PEEK cages with 20 cc of autograft in the interspace and extra extra small BMP in the cages.   The posterolateral region was decorticated and pedicle probes were placed at S  1 bilaterally. Intraoperative fluoroscopy confirmed correct orientationin the AP and lateral plane. 50 x 7.5 mm pedicle screws were placed at S 1 bilaterally.   Final x-rays demonstrated well-positioned interbody grafts and pedicle screw fixation. A 60 mm lordotic rod was placed on the right and a 65 mm rod was placed on the left locked down in situ and the posterolateral region was packed with allograft bone graft bilaterally and remaining BMP.The wound was irrigated. Long-acting Marcaine was injected in the deep musculature.   Fascia was closed with 1 Vicryl sutures  skin edges were reapproximated 2 and 3-0 Vicryl sutures. The wound was dressed with Dermabond and  an occlusive dressing the patient was extubated in the operating room and taken to recovery in stable satisfactory condition he tolerated the operation well counts were correct at the end of the case.   PLAN OF CARE: Admit to inpatient   PATIENT DISPOSITION:  PACU - hemodynamically  stable.   Delay start of Pharmacological VTE agent (>24hrs) due to surgical blood loss or risk of bleeding: yes

## 2019-05-20 MED ORDER — OXYCODONE HCL 10 MG PO TABS: 10.0000 mg | ORAL_TABLET | ORAL | 0 refills | Status: AC | PRN

## 2019-05-20 MED ORDER — METHOCARBAMOL 500 MG PO TABS: 500.0000 mg | ORAL_TABLET | Freq: Four times a day (QID) | ORAL | 1 refills | Status: AC | PRN

## 2019-05-20 NOTE — Progress Notes (Signed)
Occupational Therapy Evaluation Patient Details Name: Daniel Johns MRN: [id number] DOB: Oct 19, 1950 Today's Date: 05/20/2019    History of Present Illness Patient is a 68 y/o male who presents s/p L5-S1 PLIF with extension of existing fusion. PMH includes HTN, bil TKA, left shoulder surgery, and cervical surgery.   Clinical Impression   PTA, pt lived at home with his wife and was independent for ADLs and IADLs; used reacher for LB dressing. Pt presented with decreased balance and increased pain impacting his ability to complete ADLs. Pt recalled 3/3 back precautions. Provided education on compensatory strategies and use of AE for LB ADLs. Pt performed UB and LB dressing and brace management with supervision for safety. Pt demonstrated toilet transfer with supervision for safety. Recommend dc home with no follow up OT once medically ready per MD. All education provided and acute OT needs met, will sign off.     Follow Up Recommendations  No OT follow up    Equipment Recommendations  None recommended by OT    Recommendations for Other Services PT consult     Precautions / Restrictions Precautions Precautions: Fall;Back Precaution Booklet Issued: Yes (comment) Precaution Comments: Provided handout and reviewed precautions Required Braces or Orthoses: Spinal Brace Spinal Brace: Lumbar corset;Applied in standing position Restrictions Weight Bearing Restrictions: No      Mobility Bed Mobility               General bed mobility comments: Pt sitting in recliner upon arrival  Transfers Overall transfer level: Needs assistance Equipment used: None Transfers: Sit to/from Stand Sit to Stand: Supervision         General transfer comment: supervision for safety    Balance Overall balance assessment: Mild deficits observed, not formally tested                                         ADL either performed or assessed with clinical judgement   ADL Overall  ADL's : Needs assistance/impaired Eating/Feeding: Independent   Grooming: Wash/dry hands;Supervision/safety;Standing   Upper Body Bathing: Supervision/ safety;Sitting   Lower Body Bathing: Supervison/ safety;Sit to/from stand   Upper Body Dressing : Supervision/safety;Sitting Upper Body Dressing Details (indicate cue type and reason): pt donned shirt and brace with supervision for safety Lower Body Dressing: Supervision/safety;Sit to/from stand;With adaptive equipment Lower Body Dressing Details (indicate cue type and reason): Pt demonstrated use of AE to don LB clothing with supervision for safety Toilet Transfer: Supervision/safety;Ambulation;Regular Toilet   Toileting- Water quality scientist and Hygiene: Supervision/safety;Sit to/from stand       Functional mobility during ADLs: Supervision/safety General ADL Comments: Pt required supervision for most ADLs for safety     Vision Baseline Vision/History: Wears glasses Wears Glasses: Reading only Patient Visual Report: No change from baseline Vision Assessment?: No apparent visual deficits     Perception     Praxis      Pertinent Vitals/Pain Pain Assessment: Faces Faces Pain Scale: Hurts a little bit Pain Location: Back Pain Descriptors / Indicators: Aching;Discomfort;Sore Pain Intervention(s): Monitored during session     Hand Dominance Right   Extremity/Trunk Assessment Upper Extremity Assessment Upper Extremity Assessment: Overall WFL for tasks assessed   Lower Extremity Assessment Lower Extremity Assessment: Defer to PT evaluation   Cervical / Trunk Assessment Cervical / Trunk Assessment: Other exceptions Cervical / Trunk Exceptions: s/p back surgery   Communication Communication Communication: No difficulties   Cognition  Arousal/Alertness: Awake/alert Behavior During Therapy: WFL for tasks assessed/performed Overall Cognitive Status: Within Functional Limits for tasks assessed                                      General Comments       Exercises     Shoulder Instructions      Home Living Family/patient expects to be discharged to:: Private residence Living Arrangements: Spouse/significant other Available Help at Discharge: Family;Available 24 hours/day Type of Home: House Home Access: Stairs to enter [employer] of Steps: 3 Entrance Stairs-Rails: Right Home Layout: Two level Alternate Level Stairs-Number of Steps: 3 Alternate Level Stairs-Rails: Right Bathroom Shower/Tub: Occupational psychologist: Standard     Home Equipment: Environmental consultant - 2 wheels;Bedside commode;Shower seat - built Hotel manager: Reacher        Prior Functioning/Environment Level of Independence: Independent with assistive device(s)        Comments: PTA used reacher for LB dressing. Enjoys golfing        OT Problem List: Decreased activity tolerance;Impaired balance (sitting and/or standing);Decreased knowledge of use of DME or AE;Decreased knowledge of precautions;Pain      OT Treatment/Interventions:      OT Goals(Current goals can be found in the care plan section) Acute Rehab OT Goals Patient Stated Goal: to get back to golfing OT Goal Formulation: All assessment and education complete, DC therapy  OT Frequency:     Barriers to D/C:            Co-evaluation              AM-PAC OT "6 Clicks" Daily Activity     Outcome Measure Help from another person eating meals?: None Help from another person taking care of personal grooming?: None Help from another person toileting, which includes using toliet, bedpan, or urinal?: None Help from another person bathing (including washing, rinsing, drying)?: None Help from another person to put on and taking off regular upper body clothing?: None Help from another person to put on and taking off regular lower body clothing?: None 6 Click Score: 24   End of Session Equipment  Utilized During Treatment: Back brace Nurse Communication: Mobility status  Activity Tolerance: Patient tolerated treatment well Patient left: in chair;with call bell/phone within reach  OT Visit Diagnosis: Pain(Simultaneous filing. User may not have seen previous data.) Pain - part of body: (back)                Time: 1504-1364 OT Time Calculation (min): 15 min Charges:  OT General Charges $OT Visit: 1 Visit OT Evaluation $OT Eval Low Complexity: 1 Low  Gus Rankin, OT Student  Gus Rankin 05/20/2019, 11:19 AM

## 2019-05-20 NOTE — Plan of Care (Signed)
Patient alert and oriented, mae's well, voiding adequate amount of urine, swallowing without difficulty, no c/o pain at time of discharge. Patient discharged home with family. Script and discharged instructions given to patient. Patient and family stated understanding of instructions given. Patient has an appointment with Dr.Stern    

## 2019-05-20 NOTE — Progress Notes (Signed)
Physical Therapy Treatment Patient Details Name: Daniel Johns MRN: [id number] DOB: May 03, 1951 Today's Date: 05/20/2019    History of Present Illness Patient is a 68 y/o male who presents s/p L5-S1 PLIF with extension of existing fusion. PMH includes HTN, bil TKA, left shoulder surgery, and cervical surgery.    PT Comments    Pt progressing towards physical therapy goals. Was able to perform transfers and ambulation with light supervision for safety and no AD. Reinforced education regarding precautions, car transfer, brace application/wearing schedule, and activity progression. Will continue to follow.     Follow Up Recommendations  No PT follow up;Supervision - Intermittent     Equipment Recommendations  None recommended by PT    Recommendations for Other Services       Precautions / Restrictions Precautions Precautions: Fall;Back Precaution Booklet Issued: Yes (comment) Precaution Comments: Provided handout and reviewed precautions Required Braces or Orthoses: Spinal Brace Spinal Brace: Lumbar corset;Applied in standing position Restrictions Weight Bearing Restrictions: No    Mobility  Bed Mobility               General bed mobility comments: Pt sitting in recliner upon arrival  Transfers Overall transfer level: Needs assistance Equipment used: None Transfers: Sit to/from Stand Sit to Stand: Supervision         General transfer comment: Supervision for safety. VC's for improved posture.   Ambulation/Gait Ambulation/Gait assistance: Supervision Gait Distance (Feet): 350 Feet Assistive device: None Gait Pattern/deviations: Step-through pattern;Decreased stride length;Wide base of support Gait velocity: decreased Gait velocity interpretation: 1.31 - 2.62 ft/sec, indicative of limited community ambulator General Gait Details: Slow but generally steady. No overt LOB noted. Pt cued for improved posture and did well with a standing rest in the hall to extend  trunk to neutral before progressing on with gait training. Noted bilateral knee flexion contractures, and pt reports this is baseline due to prior TKR surgeries.    Stairs Stairs: Yes Stairs assistance: Min guard;Supervision Stair Management: One rail Right;Step to pattern;Alternating pattern;Forwards Number of Stairs: 4 General stair comments: VC's for sequencing and general safety. Pt completed without difficulty.    Wheelchair Mobility    Modified Rankin (Stroke Patients Only)       Balance Overall balance assessment: Mild deficits observed, not formally tested                                          Cognition Arousal/Alertness: Awake/alert Behavior During Therapy: WFL for tasks assessed/performed Overall Cognitive Status: Within Functional Limits for tasks assessed                                        Exercises      General Comments        Pertinent Vitals/Pain Pain Assessment: Faces Faces Pain Scale: Hurts a little bit Pain Location: Back Pain Descriptors / Indicators: Aching;Discomfort;Sore Pain Intervention(s): Monitored during session    Home Living Family/patient expects to be discharged to:: Private residence Living Arrangements: Spouse/significant other Available Help at Discharge: Family;Available 24 hours/day Type of Home: House Home Access: Stairs to enter Entrance Stairs-Rails: Right Home Layout: Two level Home Equipment: Walker - 2 wheels;Bedside commode;Shower seat - built Scientist, clinical (histocompatibility and immunogenetics)in;Adaptive equipment      Prior Function Level of Independence: Independent with assistive device(s)  Comments: PTA used reacher for LB dressing. Enjoys golfing   PT Goals (current goals can now be found in the care plan section) Acute Rehab PT Goals Patient Stated Goal: to get back to golfing PT Goal Formulation: With patient Time For Goal Achievement: 06/02/19 Potential to Achieve Goals: Good Progress towards PT goals:  Progressing toward goals    Frequency    Min 5X/week      PT Plan Current plan remains appropriate    Co-evaluation              AM-PAC PT "6 Clicks" Mobility   Outcome Measure  Help needed turning from your back to your side while in a flat bed without using bedrails?: None Help needed moving from lying on your back to sitting on the side of a flat bed without using bedrails?: None Help needed moving to and from a bed to a chair (including a wheelchair)?: A Little Help needed standing up from a chair using your arms (e.g., wheelchair or bedside chair)?: A Little Help needed to walk in hospital room?: A Little Help needed climbing 3-5 steps with a railing? : A Little 6 Click Score: 20    End of Session Equipment Utilized During Treatment: Back brace Activity Tolerance: Patient tolerated treatment well Patient left: in bed;with call bell/phone within reach;with family/visitor present;with SCD's reapplied Nurse Communication: Mobility status PT Visit Diagnosis: Difficulty in walking, not elsewhere classified (R26.2)     Time: 7741-2878 PT Time Calculation (min) (ACUTE ONLY): 13 min  Charges:  $Gait Training: 8-22 mins                     Daniel Johns, PT, DPT Acute Rehabilitation Services Pager: [phone] Office: [phone]    Thelma Comp 05/20/2019, 12:27 PM

## 2019-05-20 NOTE — Discharge Summary (Signed)
Physician Discharge Summary  Patient ID: Daniel Johns MRN: [id number] DOB/AGE: [birth year] 68 y.o.  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  Patient was doing well in AM POD 1 and was discharged home.  Consults: None  Significant Diagnostic Studies: None  Treatments: surgery:  Patient underwent decompression and fusion L 5 S 1 level   Discharge Exam: Blood pressure 127/78, pulse (!) 102, temperature 98.3 F (36.8 C), temperature source Oral, resp. rate 16, height 5\' 11"  (1.803 m), SpO2 95 %. Neurologic: Alert and oriented X 3, normal strength and tone. Normal symmetric reflexes. Normal coordination and gait Wound:CDI  Disposition: Home  Discharge Instructions    Diet - low sodium heart healthy   Complete by: As directed    Increase activity slowly   Complete by: As directed      Allergies as of 05/20/2019      Reactions   Tramadol Rash   Zanaflex [tizanidine Hcl] Other (See Comments)   hallucination       Medication List    TAKE these medications   aspirin EC 81 MG tablet Take 81 mg by mouth daily.   cetirizine 10 MG tablet Commonly known as: ZYRTEC Take 10 mg by mouth daily.   lisinopril-hydrochlorothiazide 20-12.5 MG tablet Commonly known as: ZESTORETIC Take 1 tablet by mouth daily.   methocarbamol 500 MG tablet Commonly known as: ROBAXIN Take 1 tablet (500 mg total) by mouth every 6 (six) hours as needed for muscle spasms.   oxyCODONE 5 MG immediate release  tablet Commonly known as: Oxy IR/ROXICODONE Take 1-2 tablets (5-10 mg total) by mouth every 3 (three) hours as needed for severe pain ((score 7 to 10)). What changed:   how much to take  when to take this   Oxycodone HCl 10 MG Tabs Take 1 tablet (10 mg total) by mouth every 4 (four) hours as needed for severe pain ((score 7 to 10)). What changed: You were already taking a medication with the same name, and this prescription was added. Make sure you understand how and when to take each.        Signed: Peggyann Shoals, MD 05/20/2019, 8:23 AM

## 2019-05-20 NOTE — Discharge Instructions (Signed)

## 2019-05-20 NOTE — Progress Notes (Signed)
Subjective: Patient reports doing well  Objective: Vital signs in last 24 hours: Temp:  [97 F (36.1 C)-98.3 F (36.8 C)] 98.3 F (36.8 C) (09/16 0745) Pulse Rate:  [85-102] 102 (09/16 0745) Resp:  [11-23] 16 (09/16 0745) BP: (102-138)/(69-85) 127/78 (09/16 0745) SpO2:  [94 %-99 %] 95 % (09/16 0745)  Intake/Output from previous day: 09/15 0701 - 09/16 0700 In: 1590 [P.O.:240; I.V.:1350] Out: 825 [Urine:525; Blood:300] Intake/Output this shift: No intake/output data recorded.  Physical Exam: Full strength, dressing CDI.  Pain is improved.  Lab Results: No results for input(s): WBC, HGB, HCT, PLT in the last 72 hours. BMET No results for input(s): NA, K, CL, CO2, GLUCOSE, BUN, CREATININE, CALCIUM in the last 72 hours.  Studies/Results: Dg Lumbar Spine 2-3 Views  Result Date: 05/19/2019 CLINICAL DATA:  Intraoperative imaging for L5-S1 fusion. EXAM: DG C-ARM 1-60 MIN; LUMBAR SPINE - 2-3 VIEW COMPARISON:  None. FINDINGS: Two fluoroscopic intraoperative spot views demonstrate extension of prior L3-5 fusion to the L5-S1 level with pedicle screws and interbody spacer in place. No acute abnormality. IMPRESSION: Intraoperative imaging for extension of prior lumbar fusion to include L5-S1. Electronically Signed   By: Inge Rise M.D.   On: 05/19/2019 16:11   Dg C-arm 1-60 Min  Result Date: 05/19/2019 CLINICAL DATA:  Intraoperative imaging for L5-S1 fusion. EXAM: DG C-ARM 1-60 MIN; LUMBAR SPINE - 2-3 VIEW COMPARISON:  None. FINDINGS: Two fluoroscopic intraoperative spot views demonstrate extension of prior L3-5 fusion to the L5-S1 level with pedicle screws and interbody spacer in place. No acute abnormality. IMPRESSION: Intraoperative imaging for extension of prior lumbar fusion to include L5-S1. Electronically Signed   By: Inge Rise M.D.   On: 05/19/2019 16:11    Assessment/Plan: Doing well.  Discharge home.    LOS: 1 day    Peggyann Shoals, MD 05/20/2019, 8:21 AM

## 2019-05-24 ENCOUNTER — Other Ambulatory Visit: Payer: Self-pay

## 2019-05-24 ENCOUNTER — Emergency Department (HOSPITAL_COMMUNITY): Payer: Medicare Other

## 2019-05-24 ENCOUNTER — Emergency Department (HOSPITAL_COMMUNITY)
Admission: EM | Admit: 2019-05-24 | Discharge: 2019-05-24 | Disposition: A | Discharge status: Home / Self Care | Payer: Medicare Other | Attending: Emergency Medicine | Admitting: Emergency Medicine

## 2019-05-24 ENCOUNTER — Encounter (HOSPITAL_COMMUNITY): Payer: Self-pay

## 2019-05-24 DIAGNOSIS — M545 Low back pain: Secondary | ICD-10-CM | POA: Diagnosis present

## 2019-05-24 DIAGNOSIS — K5903 Drug induced constipation: Secondary | ICD-10-CM | POA: Insufficient documentation

## 2019-05-24 DIAGNOSIS — Z7982 Long term (current) use of aspirin: Secondary | ICD-10-CM | POA: Diagnosis not present

## 2019-05-24 DIAGNOSIS — M48061 Spinal stenosis, lumbar region without neurogenic claudication: Secondary | ICD-10-CM | POA: Insufficient documentation

## 2019-05-24 DIAGNOSIS — I10 Essential (primary) hypertension: Secondary | ICD-10-CM | POA: Insufficient documentation

## 2019-05-24 DIAGNOSIS — M5442 Lumbago with sciatica, left side: Secondary | ICD-10-CM

## 2019-05-24 DIAGNOSIS — G8918 Other acute postprocedural pain: Secondary | ICD-10-CM | POA: Diagnosis not present

## 2019-05-24 MED ORDER — CYCLOBENZAPRINE HCL 5 MG PO TABS: 5.0000 mg | ORAL_TABLET | Freq: Three times a day (TID) | ORAL | 0 refills | Status: AC | PRN

## 2019-05-24 MED ORDER — GABAPENTIN 300 MG PO CAPS: 300.0000 mg | ORAL_CAPSULE | Freq: Once | ORAL | Status: DC

## 2019-05-24 MED ORDER — ACETAMINOPHEN 500 MG PO TABS
1000.0000 mg | ORAL_TABLET | Freq: Once | ORAL | Status: AC
  Administered 2019-05-24: 1000 mg via ORAL
  Filled 2019-05-24: qty 2

## 2019-05-24 MED ORDER — HYDROMORPHONE HCL 1 MG/ML IJ SOLN
1.0000 mg | Freq: Once | INTRAMUSCULAR | Status: AC
  Administered 2019-05-24: 1 mg via INTRAVENOUS
  Filled 2019-05-24: qty 1

## 2019-05-24 MED ORDER — OXYCODONE HCL 5 MG PO TABS: 10.0000 mg | ORAL_TABLET | ORAL | Status: DC | PRN

## 2019-05-24 MED ORDER — MORPHINE SULFATE (PF) 4 MG/ML IV SOLN
4.0000 mg | Freq: Once | INTRAVENOUS | Status: AC
  Administered 2019-05-24: 13:00:00 4 mg via INTRAVENOUS
  Filled 2019-05-24: qty 1

## 2019-05-24 NOTE — ED Triage Notes (Signed)
Patient complains of increased pain to lower back after having fusion to lumbar disc this past Tuesday. Sent by Vertell Limber for further evaluation

## 2019-05-24 NOTE — ED Notes (Signed)
Patient transported to MRI 

## 2019-05-24 NOTE — ED Provider Notes (Addendum)
[facility]ENC[facility] Provider Note   CSN: [id number] Arrival date & time: 05/24/19  [id number]     History   Chief Complaint No chief complaint on file.   HPI Daniel Johns is a 68 y.o. male.  Presents to ER with acute worsening of low back pain. Had L5-S1 fusion on week prior. Initially did well post operatively but over the past couple days had worsening of pain. States worse with certain movement, walking, improves with rest. Currently moderate in severity. No associated weakness, numbness, no urinary incontinence or retention. No bowel incontinence.  Has had some constipation. No abdominal pain or vomiting. Has been taking prescribed narcotics with modest relief at home.      HPI  Past Medical History:  Diagnosis Date  . Arthritis   . History of gout   . Hypertension    takes Lisinopril and HCTZ daily  . Joint pain    takes Hydrocodone as needed and Ibuprofen as well if needed  . Joint swelling   . Osteoarthritis of left knee    Severe  . PONV (postoperative nausea and vomiting)    slow to wake up hx  . Sciatica    occaionally  . Seasonal allergies    takes Zyrtec daily and Afrin nasal spray daily as needed for congestion  . Sleep apnea    cpap    Patient Active Problem List   Diagnosis Date Noted  . Degenerative lumbar spinal stenosis 05/19/2019  . Lumbar scoliosis 05/06/2018  . 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 S/P total knee arthroplasty 11/09/2013    Past Surgical History:  Procedure Laterality Date  . ANTERIOR LAT LUMBAR FUSION Right 05/06/2018   Procedure: Right Lumbar Three - Four Lumbar Four - Five Anterolateral lumbar interbody decompression fusion with percutaneous pedicle screws;  Surgeon: Maeola HarmanStern, Joseph, MD;  [facility];  Service: Neurosurgery;  Laterality: Right;  Right Lumbar Three - Four Lumbar Four - Five Anterolateral lumbar interbody decompression fusion with percutaneous pedicle screws  . BACK SURGERY     neck surgery  . bone spurs removed from left shoulder    . JOINT REPLACEMENT Left 2017  . JOINT REPLACEMENT Right 2015  . KNEE ARTHROSCOPY     x 2  . left knee arthroscopy     x 1  . LUMBAR PERCUTANEOUS PEDICLE SCREW 2 LEVEL N/A 05/06/2018   Procedure: LUMBAR PERCUTANEOUS PEDICLE SCREW TWO LEVEL;  Surgeon: Maeola HarmanStern, Joseph, MD;  [facility];  Service: Neurosurgery;  Laterality: N/A;  LUMBAR PERCUTANEOUS PEDICLE SCREW TWO LEVEL  . sty removed from left eye    . TONSILLECTOMY    . TOTAL KNEE ARTHROPLASTY Right 11/09/2013   Procedure: TOTAL KNEE ARTHROPLASTY;  Surgeon: Dannielle HuhSteve Lucey, MD;  Location: [facility] OR;  Service: Orthopedics;  Laterality: Right;  . TOTAL KNEE ARTHROPLASTY Left 09/23/2017   Procedure: TOTAL KNEE ARTHROPLASTY;  Surgeon: Dannielle HuhLucey, Steve, MD;  Location: [facility] OR;  Service: Orthopedics;  Laterality: Left;        Home Medications    Prior to Admission medications   Medication Sig Start Date End Date Taking? Authorizing Provider  aspirin EC 81 MG tablet Take 81 mg by mouth daily.    [provider]  cetirizine (ZYRTEC) 10 MG tablet Take 10 mg by mouth daily.    [provider]  cyclobenzaprine (FLEXERIL) 5 MG tablet Take 1 tablet (5 mg total) by mouth 3 (three) times daily as needed for muscle spasms. 05/24/19  Milagros Loll, MD  lisinopril-hydrochlorothiazide (PRINZIDE,ZESTORETIC) 20-12.5 MG tablet Take 1 tablet by mouth daily. 08/07/17   [provider]  methocarbamol (ROBAXIN) 500 MG tablet Take 1 tablet (500 mg total) by mouth every 6 (six) hours as needed for muscle spasms. 05/20/19   Maeola Harman, MD  oxyCODONE (OXY IR/ROXICODONE) 5 MG immediate release tablet Take 1-2 tablets (5-10 mg total) by mouth every 3 (three) hours as needed for severe pain ((score 7 to 10)). Patient taking differently: Take 5 mg by mouth 2 (two) times daily as needed for severe pain ((score 7 to 10)).  05/07/18   Maeola Harman, MD  oxyCODONE 10 MG TABS Take 1 tablet (10 mg  total) by mouth every 4 (four) hours as needed for severe pain ((score 7 to 10)). 05/20/19   Maeola Harman, MD    Family History No family history on file.  Social History Social History   Tobacco Use  . Smoking status: Never Smoker  . Smokeless tobacco: Never Used  Substance Use Topics  . Alcohol use: Yes    Alcohol/week: 14.0 standard drinks    Types: 14 Shots of liquor per week    Comment: daily LIQUOR  X 2-3  . Drug use: No     Allergies   Tramadol and Zanaflex [tizanidine hcl]   Review of Systems Review of Systems  Constitutional: Negative for chills and fever.  HENT: Negative for ear pain and sore throat.   Eyes: Negative for pain and visual disturbance.  Respiratory: Negative for cough and shortness of breath.   Cardiovascular: Negative for chest pain and palpitations.  Gastrointestinal: Negative for abdominal pain and vomiting.  Genitourinary: Negative for dysuria and hematuria.  Musculoskeletal: Positive for back pain. Negative for arthralgias.  Skin: Negative for color change and rash.  Neurological: Negative for seizures and syncope.  All other systems reviewed and are negative.    Physical Exam Updated Vital Signs BP 125/84   Pulse 81   Temp 98.3 F (36.8 C) (Oral)   Resp 18   SpO2 98%   Physical Exam Vitals signs and nursing note reviewed.  Constitutional:      Appearance: He is well-developed.  HENT:     Head: Normocephalic and atraumatic.  Eyes:     Conjunctiva/sclera: Conjunctivae normal.  Neck:     Musculoskeletal: Neck supple.  Cardiovascular:     Rate and Rhythm: Normal rate and regular rhythm.     Heart sounds: No murmur.  Pulmonary:     Effort: Pulmonary effort is normal. No respiratory distress.     Breath sounds: Normal breath sounds.  Abdominal:     Palpations: Abdomen is soft.     Tenderness: There is no abdominal tenderness.  Musculoskeletal:     Comments: Noted TTP in L spine, no edema or deformity  Skin:    General:  Skin is warm and dry.     Capillary Refill: Capillary refill takes less than 2 seconds.  Neurological:     General: No focal deficit present.     Mental Status: He is alert and oriented to person, place, and time.     Comments: 5/5 strength throughout b/l lower extremities Sensation to light touch intact in b/l lower extremities      ED Treatments / Results  Labs (all labs ordered are listed, but only abnormal results are displayed) Labs Reviewed - No data to display  EKG None  Radiology No results found.  Procedures Procedures (including critical care time)  Medications Ordered in ED Medications  HYDROmorphone (DILAUDID) injection 1 mg (1 mg Intravenous Given 05/24/19 1046)  acetaminophen (TYLENOL) tablet 1,000 mg (1,000 mg Oral Given 05/24/19 1046)  morphine 4 MG/ML injection 4 mg (4 mg Intravenous Given 05/24/19 1257)     Initial Impression / Assessment and Plan / ED Course  I have reviewed the triage vital signs and the nursing notes.  Pertinent labs & imaging results that were available during my care of the patient were reviewed by me and considered in my medical decision making (see chart for details).  Clinical Course as of Jun 02 210  Sun May 24, 2019  1436 Discussed with NSGY on call - if pain controlled, then dc and recheck in clinic   [RD]    Clinical Course User Index [RD] Lucrezia Starch, MD       68 year old male with recent L5-S1 fusion presents to ER with acute worsening of low back pain.  On exam patient was well-appearing with normal neurologic exam.  Discussed case with Dr. Venetia Constable on-call who recommended obtaining MRI.  MRI L-spine showed some increased deposition of fat and epidural space causing some moderate canal stenosis.  I discussed this finding with neurosurgery on-call.  He stated no acute intervention needed at this time but does recommend close follow-up with his primary surgeon.  Patient's pain was controlled in ER.  Remained  well-appearing on reassessment without neurologic deficit.  Will discharge home, plan for close recheck with his primary surgeon.  Patient also having some constipation, likely related to large opiates, recommended starting bowel regimen.    After the discussed management above, the patient was determined to be safe for discharge.  The patient was in agreement with this plan and all questions regarding their care were answered.  ED return precautions were discussed and the patient will return to the ED with any significant worsening of condition.   Final Clinical Impressions(s) / ED Diagnoses   Final diagnoses:  Acute midline low back pain with left-sided sciatica  Spinal stenosis of lumbar region, unspecified whether neurogenic claudication present  Drug-induced constipation    ED Discharge Orders         Ordered    cyclobenzaprine (FLEXERIL) 5 MG tablet  3 times daily PRN     05/24/19 1439           Lucrezia Starch, MD 05/24/19 1519    Lucrezia Starch, MD 06/03/19 [phone]

## 2019-05-24 NOTE — Discharge Instructions (Signed)
Please call your neurosurgeon office tomorrow morning to schedule close follow-up appointment.  Please continue the pain medication as prescribed.  May try the new muscle relaxer as well.  Additionally recommend taking Tylenol as discussed.  If you develop numbness, weakness, worsening pain, bladder or bowel incontinence, urinary tension please return to ER for reassessment.  Recommend starting course of MiraLAX to help with your constipation.

## 2021-06-23 IMAGING — DX DG LUMBAR SPINE COMPLETE 4+V
5 series · 5 of 5 positions shown · non-contrast
Comparison: Intraoperative fluoroscopic images dated 05/19/2019.
MRI lumbar spine dated 12/31/2018.

CLINICAL DATA: Severe low back pain, inability to walk, recent
surgery. Evaluate for complications.

EXAM:
LUMBAR SPINE - COMPLETE 4+ VIEW

[l-spine ap]
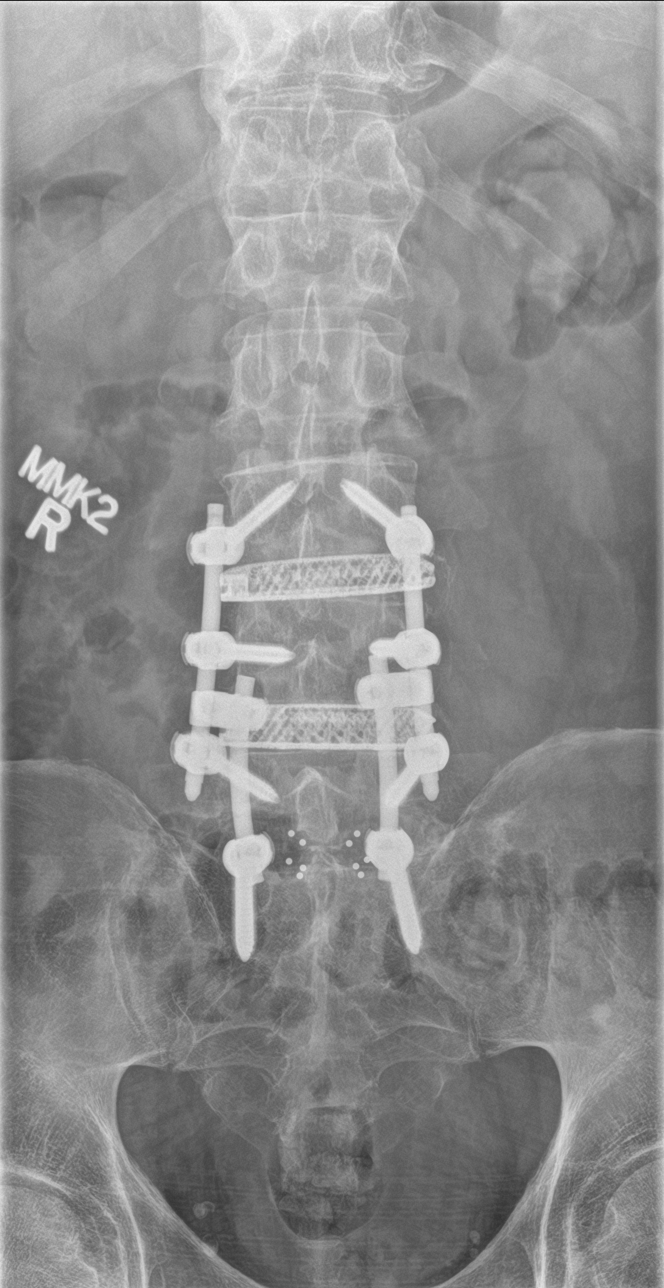

[l-spine obl (1 of 2)]
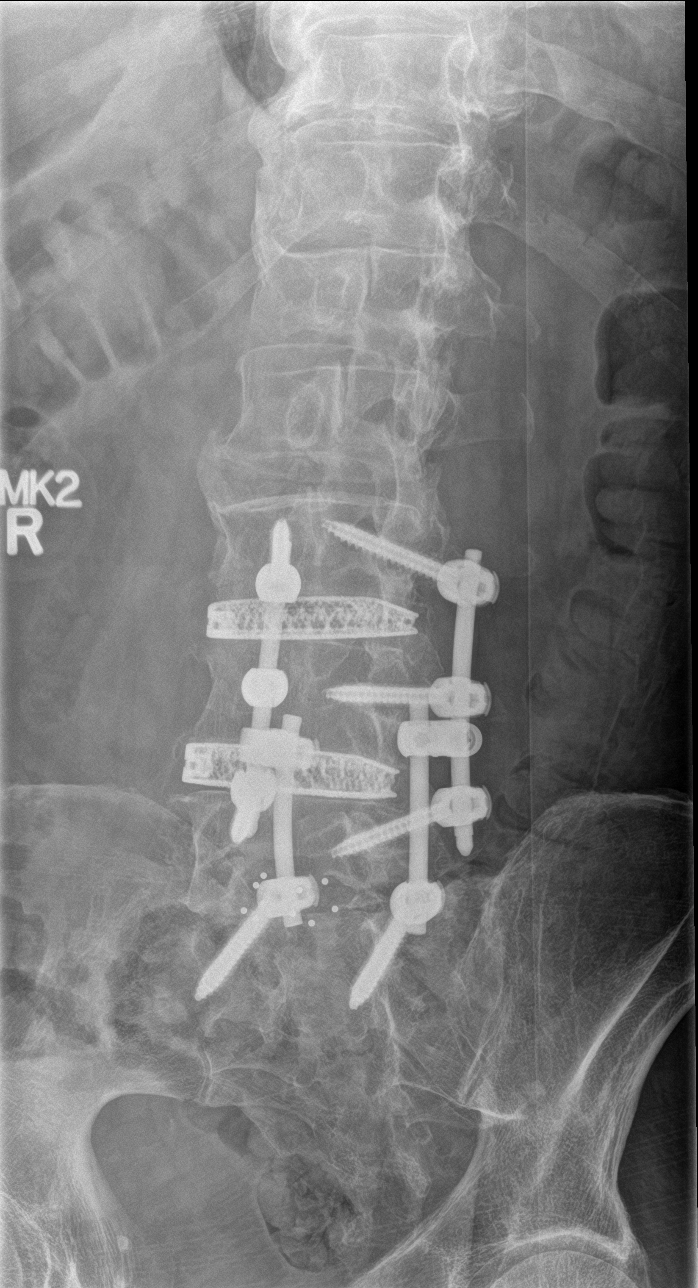

[l-spine obl (2 of 2)]
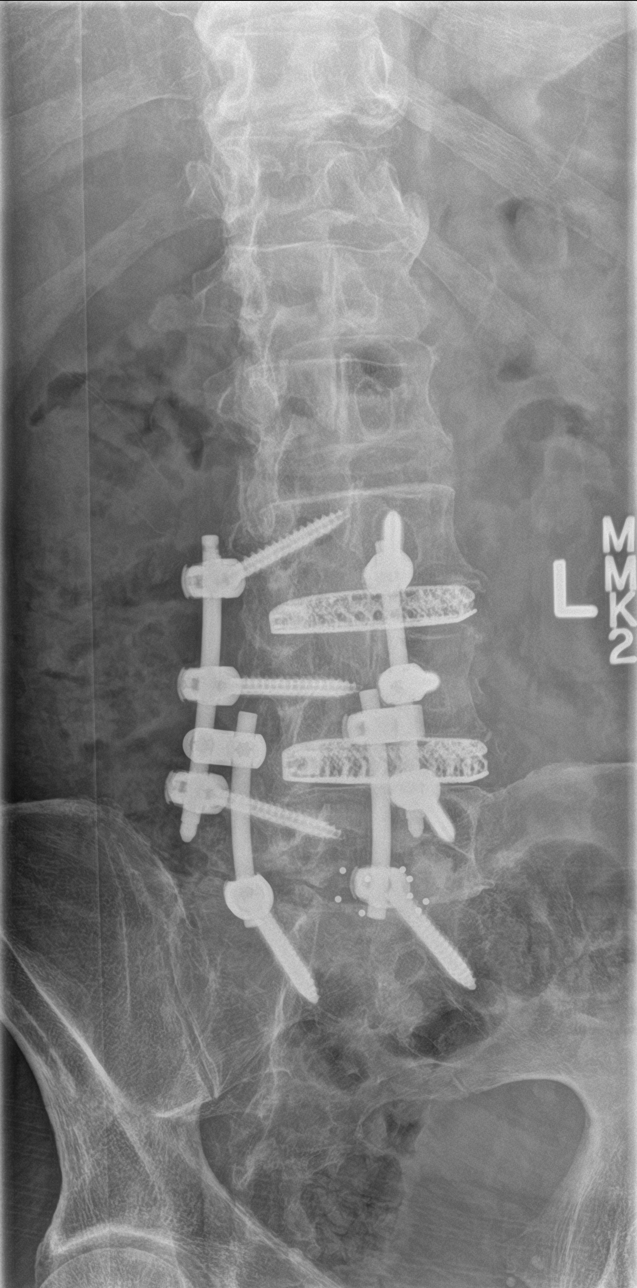

[l-spine lat]
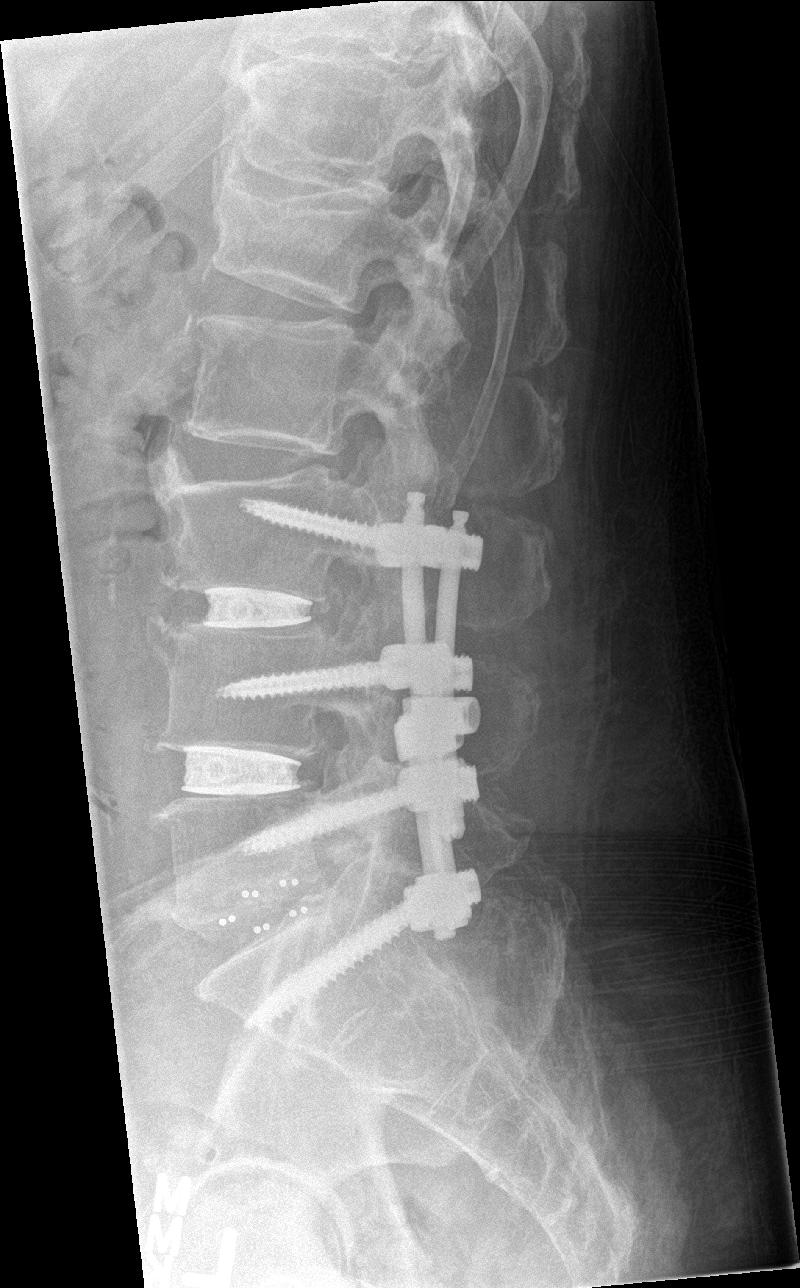

[l-spine spot]
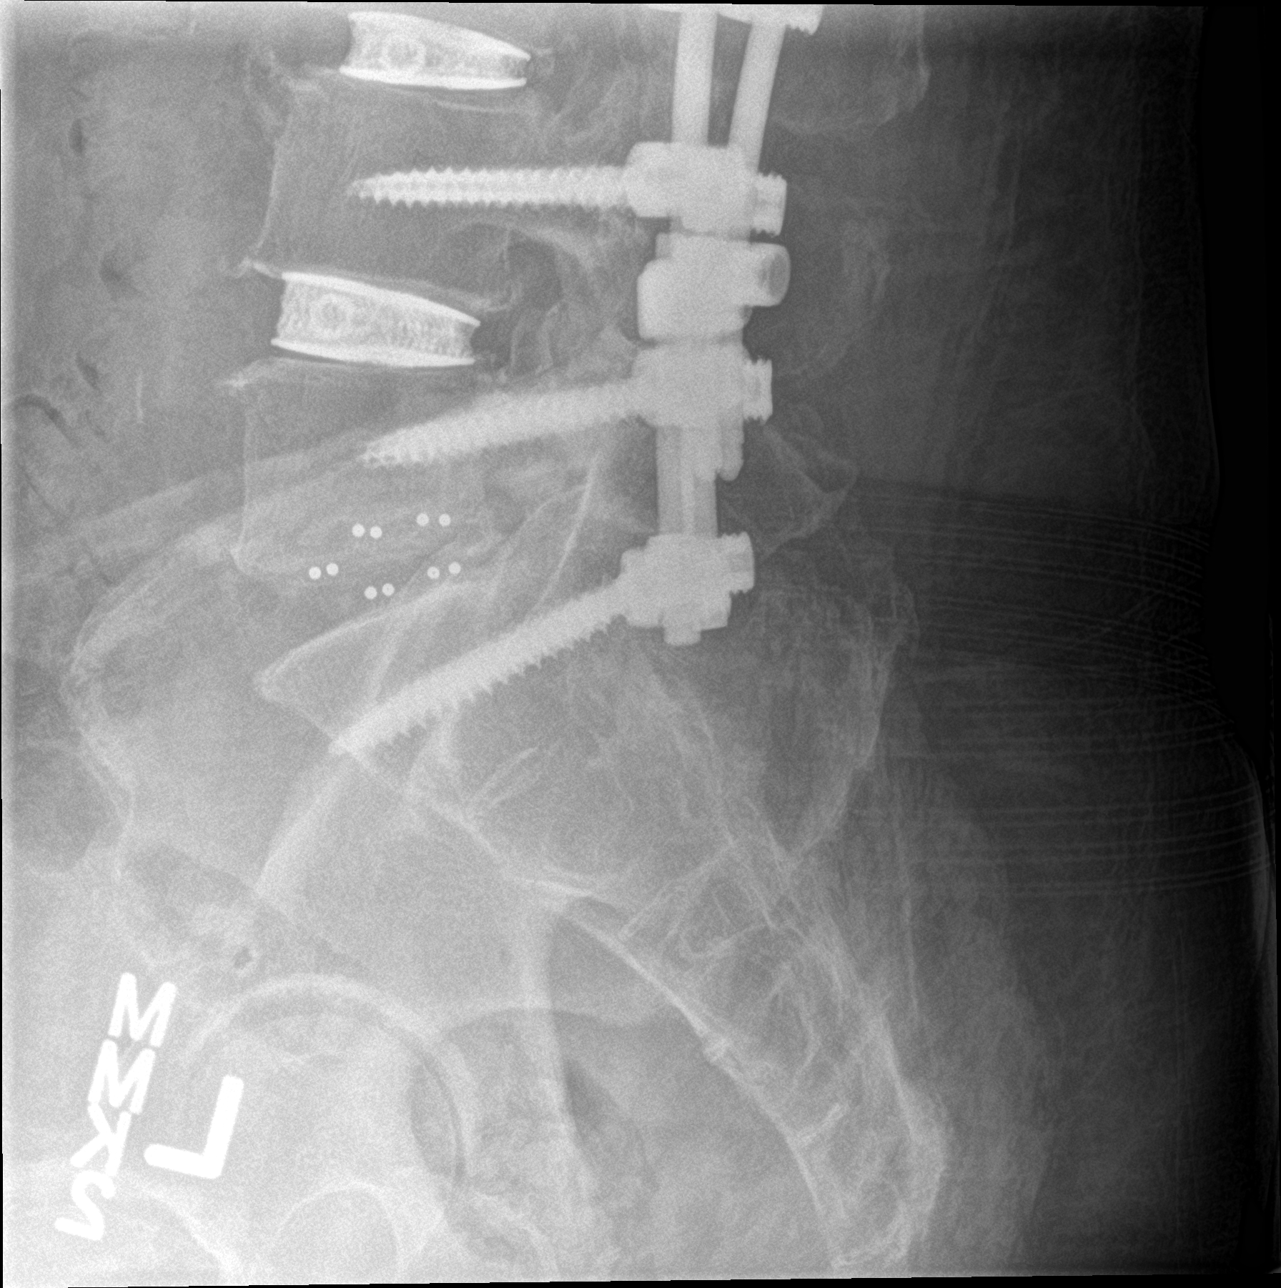

[5 of 5 positions shown; findings below may reference images not displayed]

FINDINGS: PLIF hardware appears intact and appropriately positioned at the L3
through S1 levels, with intervening disc grafts/spacers. No evidence
of acute osseous fracture or subluxation. Chronic compression
deformity involving the superior endplate of the L1 vertebral body.
Visualized paravertebral soft tissues are unremarkable.
IMPRESSION: 1. PLIF hardware appears intact and appropriately positioned at the
L3 through S1 levels, with intervening disc grafts/spacers. No
evidence of surgical complicating feature.
2. No acute findings.

## 2023-10-16 ENCOUNTER — Other Ambulatory Visit: Payer: Self-pay | Admitting: Surgery

## 2023-10-16 DIAGNOSIS — M461 Sacroiliitis, not elsewhere classified: Secondary | ICD-10-CM

## 2023-11-04 ENCOUNTER — Ambulatory Visit
Admission: RE | Admit: 2023-11-04 | Discharge: 2023-11-04 | Disposition: A | Discharge status: Home / Self Care | Payer: Medicare Other | Source: Ambulatory Visit | Attending: Surgery | Admitting: Surgery

## 2023-11-04 DIAGNOSIS — M461 Sacroiliitis, not elsewhere classified: Secondary | ICD-10-CM

## 2023-11-26 ENCOUNTER — Other Ambulatory Visit: Payer: Self-pay | Admitting: Neurological Surgery

## 2023-11-26 DIAGNOSIS — M461 Sacroiliitis, not elsewhere classified: Secondary | ICD-10-CM

## 2023-12-09 ENCOUNTER — Ambulatory Visit
Admission: RE | Admit: 2023-12-09 | Discharge: 2023-12-09 | Disposition: A | Discharge status: Home / Self Care | Source: Ambulatory Visit | Attending: Neurological Surgery | Admitting: Neurological Surgery

## 2023-12-09 DIAGNOSIS — M461 Sacroiliitis, not elsewhere classified: Secondary | ICD-10-CM

## 2024-03-31 ENCOUNTER — Other Ambulatory Visit: Payer: Self-pay | Admitting: Neurosurgery

## 2024-03-31 DIAGNOSIS — M461 Sacroiliitis, not elsewhere classified: Secondary | ICD-10-CM

## 2024-04-24 ENCOUNTER — Ambulatory Visit
Admission: RE | Admit: 2024-04-24 | Discharge: 2024-04-24 | Disposition: A | Discharge status: Home / Self Care | Source: Ambulatory Visit | Attending: Neurosurgery | Admitting: Neurosurgery

## 2024-04-24 DIAGNOSIS — M461 Sacroiliitis, not elsewhere classified: Secondary | ICD-10-CM
